# Patient Record
Sex: Female | Born: 1990
Health system: Southern US, Community
[De-identification: ages and names within clinical notes are randomized; demographics above are authoritative.]

## PROBLEM LIST (undated history)

## (undated) ENCOUNTER — Inpatient Hospital Stay (HOSPITAL_COMMUNITY): Payer: Self-pay

## (undated) DIAGNOSIS — R519 Headache, unspecified: Secondary | ICD-10-CM

## (undated) DIAGNOSIS — R87629 Unspecified abnormal cytological findings in specimens from vagina: Secondary | ICD-10-CM

## (undated) DIAGNOSIS — D649 Anemia, unspecified: Secondary | ICD-10-CM

## (undated) DIAGNOSIS — R51 Headache: Secondary | ICD-10-CM

## (undated) DIAGNOSIS — F419 Anxiety disorder, unspecified: Secondary | ICD-10-CM

## (undated) DIAGNOSIS — T7840XA Allergy, unspecified, initial encounter: Secondary | ICD-10-CM

## (undated) DIAGNOSIS — Z8619 Personal history of other infectious and parasitic diseases: Secondary | ICD-10-CM

## (undated) HISTORY — DX: Headache: R51

## (undated) HISTORY — DX: Unspecified abnormal cytological findings in specimens from vagina: R87.629

## (undated) HISTORY — DX: Allergy, unspecified, initial encounter: T78.40XA

## (undated) HISTORY — DX: Anxiety disorder, unspecified: F41.9

## (undated) HISTORY — DX: Headache, unspecified: R51.9

## (undated) HISTORY — DX: Anemia, unspecified: D64.9

## (undated) HISTORY — DX: Personal history of other infectious and parasitic diseases: Z86.19

## (undated) HISTORY — PX: WISDOM TOOTH EXTRACTION: SHX21

---
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
## 2010-08-22 ENCOUNTER — Inpatient Hospital Stay (HOSPITAL_COMMUNITY): Admission: AD | Admit: 2010-08-22 | Discharge: 2010-08-24 | Payer: Self-pay | Admitting: Obstetrics

## 2010-12-13 LAB — RPR: RPR Ser Ql: NONREACTIVE

## 2010-12-13 LAB — CBC
MCHC: 33.4 g/dL (ref 30.0–36.0)
MCHC: 33.5 g/dL (ref 30.0–36.0)
Platelets: 175 10*3/uL (ref 150–400)
RDW: 13.3 % (ref 11.5–15.5)
RDW: 13.5 % (ref 11.5–15.5)

## 2010-12-13 LAB — ABO/RH: ABO/RH(D): O POS

## 2011-07-05 LAB — RAPID STREP SCREEN (MED CTR MEBANE ONLY): Streptococcus, Group A Screen (Direct): POSITIVE — AB

## 2011-10-03 NOTE — L&D Delivery Note (Signed)
Delivery Note Called to room by RN for a precipitous labor.  At 11:35 AM a viable and healthy female was delivered via Vaginal, Spontaneous Delivery (Presentation:  Occiput Anterior).  APGAR: pend , ; weight pend .   Placenta status: Intact, Spontaneous.  Cord:  with the following complications: None.  Cord pH: n/a  Anesthesia: None  Episiotomy: None Lacerations: 1st Degree Suture Repair: None; good hemostasis Est. Blood Loss (mL): 300  Mom to postpartum.  Baby to nursery-stable.  [facility] 05/05/2012, 11:46 AM

## 2012-03-12 LAB — OB RESULTS CONSOLE ANTIBODY SCREEN: Antibody Screen: NEGATIVE

## 2012-03-12 LAB — OB RESULTS CONSOLE HEPATITIS B SURFACE ANTIGEN: Hepatitis B Surface Ag: NEGATIVE

## 2012-03-12 LAB — OB RESULTS CONSOLE GC/CHLAMYDIA: Chlamydia: NEGATIVE

## 2012-03-12 LAB — OB RESULTS CONSOLE RPR: RPR: NONREACTIVE

## 2012-03-12 LAB — OB RESULTS CONSOLE RUBELLA ANTIBODY, IGM: Rubella: IMMUNE

## 2012-03-14 DIAGNOSIS — R112 Nausea with vomiting, unspecified: Secondary | ICD-10-CM

## 2012-04-12 ENCOUNTER — Inpatient Hospital Stay (HOSPITAL_COMMUNITY)
Admission: AD | Admit: 2012-04-12 | Discharge: 2012-04-12 | Disposition: A | Discharge status: Home / Self Care | Payer: Medicaid Other | Source: Ambulatory Visit | Attending: Obstetrics | Admitting: Obstetrics

## 2012-04-12 ENCOUNTER — Encounter (HOSPITAL_COMMUNITY): Payer: Self-pay | Admitting: *Deleted

## 2012-04-12 DIAGNOSIS — O4690 Antepartum hemorrhage, unspecified, unspecified trimester: Secondary | ICD-10-CM | POA: Insufficient documentation

## 2012-04-12 DIAGNOSIS — R112 Nausea with vomiting, unspecified: Secondary | ICD-10-CM

## 2012-04-12 DIAGNOSIS — O2390 Unspecified genitourinary tract infection in pregnancy, unspecified trimester: Secondary | ICD-10-CM | POA: Insufficient documentation

## 2012-04-12 DIAGNOSIS — O212 Late vomiting of pregnancy: Secondary | ICD-10-CM | POA: Insufficient documentation

## 2012-04-12 DIAGNOSIS — R197 Diarrhea, unspecified: Secondary | ICD-10-CM | POA: Insufficient documentation

## 2012-04-12 DIAGNOSIS — N72 Inflammatory disease of cervix uteri: Secondary | ICD-10-CM | POA: Insufficient documentation

## 2012-04-12 LAB — URINALYSIS, ROUTINE W REFLEX MICROSCOPIC
Glucose, UA: NEGATIVE mg/dL
Ketones, ur: NEGATIVE mg/dL
Nitrite: NEGATIVE
Protein, ur: NEGATIVE mg/dL

## 2012-04-12 LAB — BASIC METABOLIC PANEL
Calcium: 9 mg/dL (ref 8.4–10.5)
Creatinine, Ser: 0.53 mg/dL (ref 0.50–1.10)
GFR calc Af Amer: >90 mL/min (ref >90)
Potassium: 4.1 mEq/L (ref 3.5–5.1)

## 2012-04-12 LAB — CBC
HCT: 28.9 % — ABNORMAL LOW (ref 36.0–46.0)
Hemoglobin: 9.8 g/dL — ABNORMAL LOW (ref 12.0–15.0)
MCHC: 33.9 g/dL (ref 30.0–36.0)

## 2012-04-12 LAB — URINE MICROSCOPIC-ADD ON

## 2012-04-12 MED ORDER — ONDANSETRON HCL 4 MG/2ML IJ SOLN
4.0000 mg | Freq: Once | INTRAMUSCULAR | Status: AC
  Administered 2012-04-12: 4 mg via INTRAVENOUS
  Filled 2012-04-12: qty 2

## 2012-04-12 MED ORDER — ONDANSETRON 8 MG PO TBDP: 8.0000 mg | ORAL_TABLET | Freq: Three times a day (TID) | ORAL | Status: AC | PRN

## 2012-04-12 MED ORDER — ONDANSETRON 8 MG PO TBDP: 8.0000 mg | ORAL_TABLET | Freq: Three times a day (TID) | ORAL | Status: DC | PRN

## 2012-04-12 MED ORDER — LACTATED RINGERS IV BOLUS (SEPSIS)
1000.0000 mL | Freq: Once | INTRAVENOUS | Status: AC
  Administered 2012-04-12: 1000 mL via INTRAVENOUS

## 2012-04-12 NOTE — MAU Note (Signed)
Vomitting & diarrhea since Monday, abd cramping.  Pt C/O spotting with urination, no heavy bleeding or LOF.

## 2012-04-12 NOTE — MAU Provider Note (Signed)
History     CSN: [id number]  Arrival date & time 04/12/12  1411   None     Chief Complaint  Patient presents with  . Emesis  . Diarrhea    HPI Ann Santiago is a 21 y.o. female @ [redacted]w[redacted]d gestation who presents to MAU for nausea, vomiting and diarrhea. The symptoms started 4 days ago. Vomiting 4 times a day that looks like undigested food. Diarrhea 6 times a day and is watery brown with bad odor. Patient has been trying to eat soup, spaghetti, cook out, hot dogs and symptoms continue. Cramping in lower abdomen x 2 weeks.  No one else at home has had similar symptoms. The history was provided by the patient.   Past Medical History  Diagnosis Date  . No pertinent past medical history     Past Surgical History  Procedure Date  . Wisdom tooth extraction     History reviewed. No pertinent family history.  History  Substance Use Topics  . Smoking status: Never Smoker   . Smokeless tobacco: Not on file  . Alcohol Use: No    OB History    Grav Para Term Preterm Abortions TAB SAB Ect Mult Living   2 1 1       1       Review of Systems  Constitutional: Negative for fever, chills, diaphoresis and fatigue.  HENT: Negative for ear pain, congestion, sore throat, facial swelling, neck pain, neck stiffness, dental problem and sinus pressure.   Eyes: Negative for photophobia, pain, discharge and visual disturbance.  Respiratory: Negative for cough, chest tightness, shortness of breath and wheezing.   Cardiovascular: Negative for chest pain, palpitations and leg swelling.  Gastrointestinal: Positive for nausea, vomiting, abdominal pain (cramping) and diarrhea. Negative for constipation and abdominal distention.  Genitourinary: Positive for vaginal discharge. Negative for dysuria, urgency, frequency (pressure), flank pain and difficulty urinating. Vaginal bleeding: spotting   Musculoskeletal: Positive for back pain. Negative for myalgias and gait problem.  Skin: Negative for color change  and rash.  Neurological: Positive for headaches. Negative for dizziness, speech difficulty, weakness, light-headedness and numbness.  Hematological: Negative for adenopathy.  Psychiatric/Behavioral: Negative for confusion and agitation. The patient is not nervous/anxious.     Allergies  Review of patient's allergies indicates no known allergies.  Home Medications  No current outpatient prescriptions on file.  BP 107/66  Pulse 86  Temp 97.3 F (36.3 C) (Oral)  Resp 18  Physical Exam  Nursing note and vitals reviewed. Constitutional: She is oriented to person, place, and time. She appears well-developed and well-nourished. No distress.  HENT:  Head: Normocephalic.  Eyes: EOM are normal.  Neck: Normal range of motion. Neck supple.  Cardiovascular: Normal rate.   Pulmonary/Chest: Effort normal.  Abdominal: Soft. There is no tenderness.  Genitourinary:       External genitalia without lesions. Cervix external os 2 cm. Internal os 1 cm. 30%   Musculoskeletal: Normal range of motion.  Neurological: She is alert and oriented to person, place, and time. No cranial nerve deficit.  Skin: Skin is warm and dry.  Psychiatric: She has a normal mood and affect. Her behavior is normal. Judgment and thought content normal.   Results for orders placed during the hospital encounter of 04/12/12 (from the past 24 hour(s))  URINALYSIS, ROUTINE W REFLEX MICROSCOPIC     Status: Abnormal   Collection Time   04/12/12  2:20 PM      Component Value Range   Color,  Urine YELLOW  YELLOW   APPearance HAZY (*) CLEAR   Specific Gravity, Urine 1.015  1.005 - 1.030   pH 7.0  5.0 - 8.0   Glucose, UA NEGATIVE  NEGATIVE mg/dL   Hgb urine dipstick NEGATIVE  NEGATIVE   Bilirubin Urine NEGATIVE  NEGATIVE   Ketones, ur NEGATIVE  NEGATIVE mg/dL   Protein, ur NEGATIVE  NEGATIVE mg/dL   Urobilinogen, UA 1.0  0.0 - 1.0 mg/dL   Nitrite NEGATIVE  NEGATIVE   Leukocytes, UA SMALL (*) NEGATIVE  URINE MICROSCOPIC-ADD  ON     Status: Abnormal   Collection Time   04/12/12  2:20 PM      Component Value Range   Squamous Epithelial / LPF FEW (*) RARE   WBC, UA 3-6  <3 WBC/hpf   Bacteria, UA FEW (*) RARE   Urine-Other MUCOUS PRESENT    CBC     Status: Abnormal   Collection Time   04/12/12  3:43 PM      Component Value Range   WBC 8.3  4.0 - 10.5 K/uL   RBC 3.02 (*) 3.87 - 5.11 MIL/uL   Hemoglobin 9.8 (*) 12.0 - 15.0 g/dL   HCT 16.1 (*) 09.6 - 04.5 %   MCV 95.7  78.0 - 100.0 fL   MCH 32.5  26.0 - 34.0 pg   MCHC 33.9  30.0 - 36.0 g/dL   RDW 40.9  81.1 - 91.4 %   Platelets 171  150 - 400 K/uL  BASIC METABOLIC PANEL     Status: Abnormal   Collection Time   04/12/12  3:43 PM      Component Value Range   Sodium 133 (*) 135 - 145 mEq/L   Potassium 4.1  3.5 - 5.1 mEq/L   Chloride 102  96 - 112 mEq/L   CO2 23  19 - 32 mEq/L   Glucose, Bld 70  70 - 99 mg/dL   BUN 5 (*) 6 - 23 mg/dL   Creatinine, Ser 7.82  0.50 - 1.10 mg/dL   Calcium 9.0  8.4 - 95.6 mg/dL   GFR calc non Af Amer >90  >90 mL/min   GFR calc Af Amer >90  >90 mL/min  WET PREP, GENITAL     Status: Abnormal   Collection Time   04/12/12  5:50 PM      Component Value Range   Yeast Wet Prep HPF POC NONE SEEN  NONE SEEN   Trich, Wet Prep NONE SEEN  NONE SEEN   Clue Cells Wet Prep HPF POC FEW (*) NONE SEEN   WBC, Wet Prep HPF POC MANY (*) NONE SEEN    Assessment: 21 y.o. @ [redacted]w[redacted]d gestation with nausea, vomiting and diarrhea   Vaginal bleeding    Cervicitis  Plan:  IV hydration, Zofran   Zithromax 1 gram po now   Follow up in the office on Monday   Rx Zofran   B.R.A.T. diet  Patient feeling much better after IV hydration and Zofran. ED Course: consult with Dr. Tamela Oddi, agrees with plan of care.   Procedures I have reviewed this patient's vital signs, nurses notes and appropriate labs. I have discussed with the patient results of labs and need for immediate return if bleeding increases or she develops pain. I have discussed with the  patient in detail plan of care. Patient voices understanding.   MDM

## 2012-04-13 LAB — GC/CHLAMYDIA PROBE AMP, GENITAL
Chlamydia, DNA Probe: NEGATIVE
GC Probe Amp, Genital: NEGATIVE

## 2012-05-02 ENCOUNTER — Encounter (HOSPITAL_COMMUNITY): Payer: Self-pay | Admitting: *Deleted

## 2012-05-02 ENCOUNTER — Telehealth (HOSPITAL_COMMUNITY): Payer: Self-pay | Admitting: *Deleted

## 2012-05-02 NOTE — Telephone Encounter (Signed)
Preadmission screen  

## 2012-05-05 ENCOUNTER — Encounter (HOSPITAL_COMMUNITY): Payer: Self-pay | Admitting: Obstetrics and Gynecology

## 2012-05-05 ENCOUNTER — Inpatient Hospital Stay (HOSPITAL_COMMUNITY)
Admission: AD | Admit: 2012-05-05 | Discharge: 2012-05-07 | DRG: 775 | Disposition: A | Discharge status: Home / Self Care | Payer: Medicaid Other | Source: Ambulatory Visit | Attending: Obstetrics | Admitting: Obstetrics

## 2012-05-05 DIAGNOSIS — O99892 Other specified diseases and conditions complicating childbirth: Principal | ICD-10-CM | POA: Diagnosis present

## 2012-05-05 DIAGNOSIS — Z22330 Carrier of Group B streptococcus: Secondary | ICD-10-CM

## 2012-05-05 LAB — CBC
HCT: 33.6 % — ABNORMAL LOW (ref 36.0–46.0)
MCV: 95.7 fL (ref 78.0–100.0)
RDW: 13.4 % (ref 11.5–15.5)
WBC: 6.9 10*3/uL (ref 4.0–10.5)

## 2012-05-05 MED ORDER — IBUPROFEN 600 MG PO TABS
600.0000 mg | ORAL_TABLET | Freq: Four times a day (QID) | ORAL | Status: DC
  Administered 2012-05-05 – 2012-05-07 (×8): 600 mg via ORAL
  Filled 2012-05-05 (×8): qty 1

## 2012-05-05 MED ORDER — OXYCODONE-ACETAMINOPHEN 5-325 MG PO TABS
1.0000 | ORAL_TABLET | ORAL | Status: DC | PRN
  Administered 2012-05-05 – 2012-05-06 (×2): 1 via ORAL
  Filled 2012-05-05 (×2): qty 1

## 2012-05-05 MED ORDER — OXYCODONE-ACETAMINOPHEN 5-325 MG PO TABS
1.0000 | ORAL_TABLET | ORAL | Status: DC | PRN
  Administered 2012-05-05: 1 via ORAL
  Filled 2012-05-05: qty 1

## 2012-05-05 MED ORDER — SENNOSIDES-DOCUSATE SODIUM 8.6-50 MG PO TABS
2.0000 | ORAL_TABLET | Freq: Every day | ORAL | Status: DC
  Administered 2012-05-05 – 2012-05-06 (×2): 2 via ORAL

## 2012-05-05 MED ORDER — BENZOCAINE-MENTHOL 20-0.5 % EX AERO
1.0000 "application " | INHALATION_SPRAY | CUTANEOUS | Status: DC | PRN
  Filled 2012-05-05: qty 56

## 2012-05-05 MED ORDER — PHENYLEPHRINE 40 MCG/ML (10ML) SYRINGE FOR IV PUSH (FOR BLOOD PRESSURE SUPPORT)
80.0000 ug | PREFILLED_SYRINGE | INTRAVENOUS | Status: DC | PRN
  Filled 2012-05-05: qty 5
  Filled 2012-05-05: qty 2

## 2012-05-05 MED ORDER — PHENYLEPHRINE 40 MCG/ML (10ML) SYRINGE FOR IV PUSH (FOR BLOOD PRESSURE SUPPORT)
80.0000 ug | PREFILLED_SYRINGE | INTRAVENOUS | Status: DC | PRN
  Filled 2012-05-05: qty 2

## 2012-05-05 MED ORDER — ZOLPIDEM TARTRATE 5 MG PO TABS: 5.0000 mg | ORAL_TABLET | Freq: Every evening | ORAL | Status: DC | PRN

## 2012-05-05 MED ORDER — EPHEDRINE 5 MG/ML INJ
10.0000 mg | INTRAVENOUS | Status: DC | PRN
  Filled 2012-05-05: qty 4
  Filled 2012-05-05: qty 2

## 2012-05-05 MED ORDER — OXYTOCIN BOLUS FROM INFUSION
250.0000 mL | Freq: Once | INTRAVENOUS | Status: DC
  Filled 2012-05-05: qty 500

## 2012-05-05 MED ORDER — SIMETHICONE 80 MG PO CHEW: 80.0000 mg | CHEWABLE_TABLET | ORAL | Status: DC | PRN

## 2012-05-05 MED ORDER — FLEET ENEMA 7-19 GM/118ML RE ENEM: 1.0000 | ENEMA | RECTAL | Status: DC | PRN

## 2012-05-05 MED ORDER — LACTATED RINGERS IV SOLN: 500.0000 mL | Freq: Once | INTRAVENOUS | Status: DC

## 2012-05-05 MED ORDER — ACETAMINOPHEN 325 MG PO TABS: 650.0000 mg | ORAL_TABLET | ORAL | Status: DC | PRN

## 2012-05-05 MED ORDER — LIDOCAINE HCL (PF) 1 % IJ SOLN
30.0000 mL | INTRAMUSCULAR | Status: DC | PRN
  Filled 2012-05-05 (×2): qty 30

## 2012-05-05 MED ORDER — LACTATED RINGERS IV SOLN
INTRAVENOUS | Status: DC
  Administered 2012-05-05: 11:00:00 via INTRAVENOUS

## 2012-05-05 MED ORDER — LANOLIN HYDROUS EX OINT: TOPICAL_OINTMENT | CUTANEOUS | Status: DC | PRN

## 2012-05-05 MED ORDER — TETANUS-DIPHTH-ACELL PERTUSSIS 5-2.5-18.5 LF-MCG/0.5 IM SUSP
0.5000 mL | Freq: Once | INTRAMUSCULAR | Status: AC
  Administered 2012-05-07: 0.5 mL via INTRAMUSCULAR
  Filled 2012-05-05 (×2): qty 0.5

## 2012-05-05 MED ORDER — ONDANSETRON HCL 4 MG/2ML IJ SOLN: 4.0000 mg | INTRAMUSCULAR | Status: DC | PRN

## 2012-05-05 MED ORDER — LACTATED RINGERS IV SOLN: 500.0000 mL | INTRAVENOUS | Status: DC | PRN

## 2012-05-05 MED ORDER — CITRIC ACID-SODIUM CITRATE 334-500 MG/5ML PO SOLN: 30.0000 mL | ORAL | Status: DC | PRN

## 2012-05-05 MED ORDER — OXYTOCIN 40 UNITS IN LACTATED RINGERS INFUSION - SIMPLE MED: 62.5000 mL/h | INTRAVENOUS | Status: DC | PRN

## 2012-05-05 MED ORDER — WITCH HAZEL-GLYCERIN EX PADS: 1.0000 "application " | MEDICATED_PAD | CUTANEOUS | Status: DC | PRN

## 2012-05-05 MED ORDER — ONDANSETRON HCL 4 MG/2ML IJ SOLN: 4.0000 mg | Freq: Four times a day (QID) | INTRAMUSCULAR | Status: DC | PRN

## 2012-05-05 MED ORDER — ONDANSETRON HCL 4 MG PO TABS: 4.0000 mg | ORAL_TABLET | ORAL | Status: DC | PRN

## 2012-05-05 MED ORDER — EPHEDRINE 5 MG/ML INJ
10.0000 mg | INTRAVENOUS | Status: DC | PRN
  Filled 2012-05-05: qty 2

## 2012-05-05 MED ORDER — OXYTOCIN 40 UNITS IN LACTATED RINGERS INFUSION - SIMPLE MED
62.5000 mL/h | Freq: Once | INTRAVENOUS | Status: AC
  Administered 2012-05-05: 62.5 mL/h via INTRAVENOUS
  Filled 2012-05-05: qty 1000

## 2012-05-05 MED ORDER — PRENATAL MULTIVITAMIN CH
1.0000 | ORAL_TABLET | Freq: Every day | ORAL | Status: DC
  Administered 2012-05-05 – 2012-05-07 (×3): 1 via ORAL
  Filled 2012-05-05 (×3): qty 1

## 2012-05-05 MED ORDER — DIBUCAINE 1 % RE OINT
1.0000 "application " | TOPICAL_OINTMENT | RECTAL | Status: DC | PRN
  Filled 2012-05-05: qty 28

## 2012-05-05 MED ORDER — PENICILLIN G POTASSIUM 5000000 UNITS IJ SOLR
2.5000 10*6.[IU] | INTRAVENOUS | Status: DC
  Filled 2012-05-05 (×4): qty 2.5

## 2012-05-05 MED ORDER — PENICILLIN G POTASSIUM 5000000 UNITS IJ SOLR
5.0000 10*6.[IU] | Freq: Once | INTRAVENOUS | Status: AC
  Administered 2012-05-05: 5 10*6.[IU] via INTRAVENOUS
  Filled 2012-05-05: qty 5

## 2012-05-05 MED ORDER — DIPHENHYDRAMINE HCL 50 MG/ML IJ SOLN: 12.5000 mg | INTRAMUSCULAR | Status: DC | PRN

## 2012-05-05 MED ORDER — MEDROXYPROGESTERONE ACETATE 150 MG/ML IM SUSP: 150.0000 mg | INTRAMUSCULAR | Status: DC | PRN

## 2012-05-05 MED ORDER — DIPHENHYDRAMINE HCL 25 MG PO CAPS: 25.0000 mg | ORAL_CAPSULE | Freq: Four times a day (QID) | ORAL | Status: DC | PRN

## 2012-05-05 MED ORDER — IBUPROFEN 600 MG PO TABS
600.0000 mg | ORAL_TABLET | Freq: Four times a day (QID) | ORAL | Status: DC | PRN
  Administered 2012-05-05: 600 mg via ORAL
  Filled 2012-05-05: qty 1

## 2012-05-05 MED ORDER — FENTANYL 2.5 MCG/ML BUPIVACAINE 1/10 % EPIDURAL INFUSION (WH - ANES)
14.0000 mL/h | INTRAMUSCULAR | Status: DC
  Filled 2012-05-05: qty 60

## 2012-05-05 NOTE — H&P (Signed)
Ann Santiago is a 21 y.o. female presenting for UC's. Maternal Medical History:  Reason for admission: Reason for admission: contractions.  Contractions: Frequency: regular.    Fetal activity: Perceived fetal activity is normal.   Last perceived fetal movement was within the past hour.    Prenatal complications: no prenatal complications Prenatal Complications - Diabetes: none.    OB History    Grav Para Term Preterm Abortions TAB SAB Ect Mult Living   2 1 1       1      Past Medical History  Diagnosis Date  . No pertinent past medical history   . History of chlamydia    Past Surgical History  Procedure Date  . Wisdom tooth extraction    Family History: family history includes Hypertension in her maternal grandmother. Social History:  reports that she has never smoked. She has never used smokeless tobacco. She reports that she does not drink alcohol or use illicit drugs.   Prenatal Transfer Tool  Maternal Diabetes: No Genetic Screening: Normal Maternal Ultrasounds/Referrals: Normal Fetal Ultrasounds or other Referrals:  None Maternal Substance Abuse:  No Significant Maternal Medications:  None Significant Maternal Lab Results:  None Other Comments:  None  Review of Systems  All other systems reviewed and are negative.    Dilation: 6.5 Effacement (%): 100 Station: 0 Exam by:: J. Rasch RN  Blood pressure 122/76, pulse 76, temperature 97.5 F (36.4 C), temperature source Oral, resp. rate 18, height 5\' 3"  (1.6 m), weight 52.617 kg (116 lb). Maternal Exam:  Uterine Assessment: Contraction strength is firm.  Abdomen: Patient reports no abdominal tenderness. Fetal presentation: vertex     Physical Exam  Nursing note and vitals reviewed. Constitutional: She is oriented to person, place, and time. She appears well-developed and well-nourished.  HENT:  Head: Normocephalic and atraumatic.  Eyes: Conjunctivae are normal. Pupils are equal, round, and reactive to  light.  Neck: Normal range of motion. Neck supple.  Cardiovascular: Normal rate and regular rhythm.   Respiratory: Effort normal and breath sounds normal.  GI: Soft.  Genitourinary: Vagina normal and uterus normal.  Musculoskeletal: Normal range of motion.  Neurological: She is oriented to person, place, and time.  Skin: Skin is warm and dry.  Psychiatric: She has a normal mood and affect. Her behavior is normal. Judgment and thought content normal.    Prenatal labs: ABO, Rh:   Antibody: Negative (06/11 0000) Rubella: Immune (06/11 0000) RPR: Nonreactive (06/11 0000)  HBsAg: Negative (06/11 0000)  HIV: Non-reactive (06/11 0000)  GBS: Positive (07/17 0000)   Assessment/Plan: Term pregnancy.  Active labor.  Expectant management.   Jarrette Dehner A 05/05/2012, 11:54 AM

## 2012-05-05 NOTE — MAU Note (Signed)
Pt reports having ctx on and off since this morning. Denies bleeding or leaking and reports good fetal movement

## 2012-05-05 NOTE — Progress Notes (Signed)
Notified of cervical exam, Fetal tracing, pt requests for epidural. Orders received.

## 2012-05-05 NOTE — Progress Notes (Signed)
Ann Santiago is a 21 y.o. G2P1001 at [redacted]w[redacted]d by LMP admitted for active labor  Subjective:   Objective: BP 122/76  Pulse 76  Temp 97.5 F (36.4 C) (Oral)  Resp 18  Ht 5\' 3"  (1.6 m)  Wt 52.617 kg (116 lb)  BMI 20.55 kg/m2      FHT:  FHR: 150 bpm, variability: moderate,  accelerations:  Present,  decelerations:  Absent UC:   regular, every 3 minutes SVE:   Dilation: 6.5 Effacement (%): 100 Station: 0 Exam by:: J. Rasch RN   Labs: Lab Results  Component Value Date   WBC 6.9 05/05/2012   HGB 11.2* 05/05/2012   HCT 33.6* 05/05/2012   MCV 95.7 05/05/2012   PLT 153 05/05/2012    Assessment / Plan: Spontaneous labor, progressing normally  Labor: Progressing normally Preeclampsia:  n/a Fetal Wellbeing:  Category I Pain Control:  Labor support without medications I/D:  n/a Anticipated MOD:  NSVD  HARPER,CHARLES A 05/05/2012, 12:01 PM

## 2012-05-06 LAB — CBC
HCT: 27.4 % — ABNORMAL LOW (ref 36.0–46.0)
MCV: 95.1 fL (ref 78.0–100.0)
RBC: 2.88 MIL/uL — ABNORMAL LOW (ref 3.87–5.11)
WBC: 10.4 10*3/uL (ref 4.0–10.5)

## 2012-05-06 NOTE — Progress Notes (Signed)
Patient ID: Ann Santiago, female   DOB: 07/02/1991, 21 y.o.   MRN: [id number] Postpartum day one Vital signs normal Fundus firm Lochia moderate Legs negative Patient has no complaints

## 2012-05-06 NOTE — Progress Notes (Signed)
Ur chart review completed.  

## 2012-05-07 NOTE — Discharge Summary (Signed)
Obstetric Discharge Summary Reason for Admission: onset of labor Prenatal Procedures: none Intrapartum Procedures: spontaneous vaginal delivery Postpartum Procedures: none Complications-Operative and Postpartum: none Hemoglobin  Date Value Range Status  05/06/2012 9.4* 12.0 - 15.0 g/dL Final     HCT  Date Value Range Status  05/06/2012 27.4* 36.0 - 46.0 % Final    Physical Exam:  General: alert Lochia: appropriate Uterine Fundus: firm Incision: healing well DVT Evaluation: No evidence of DVT seen on physical exam.  Discharge Diagnoses: Term Pregnancy-delivered  Discharge Information: Date: 05/07/2012 Activity: pelvic rest Diet: routine Medications: Percocet Condition: stable Instructions: refer to practice specific booklet Discharge to: home Follow-up Information    Follow up with MARSHALL,BERNARD A, MD. Call in 6 weeks.   Contact information:   [address] [place] [address] [phone]          Newborn Data: Live born female  Birth Weight: 6 lb 8.4 oz (2960 g) APGAR: 9,   Home with mother.  MARSHALL,BERNARD A 05/07/2012, 7:08 AM

## 2012-05-13 ENCOUNTER — Encounter (HOSPITAL_COMMUNITY): Payer: Self-pay | Admitting: *Deleted

## 2012-05-13 ENCOUNTER — Inpatient Hospital Stay (HOSPITAL_COMMUNITY)
Admission: AD | Admit: 2012-05-13 | Discharge: 2012-05-13 | Disposition: A | Discharge status: Home / Self Care | Payer: Medicaid Other | Source: Ambulatory Visit | Attending: Obstetrics | Admitting: Obstetrics

## 2012-05-13 DIAGNOSIS — R51 Headache: Secondary | ICD-10-CM

## 2012-05-13 DIAGNOSIS — O864 Pyrexia of unknown origin following delivery: Secondary | ICD-10-CM | POA: Insufficient documentation

## 2012-05-13 DIAGNOSIS — O99893 Other specified diseases and conditions complicating puerperium: Secondary | ICD-10-CM | POA: Insufficient documentation

## 2012-05-13 LAB — CBC WITH DIFFERENTIAL/PLATELET
Basophils Relative: 0 % (ref 0–1)
Eosinophils Absolute: 0.1 10*3/uL (ref 0.0–0.7)
HCT: 32.1 % — ABNORMAL LOW (ref 36.0–46.0)
Hemoglobin: 10.8 g/dL — ABNORMAL LOW (ref 12.0–15.0)
MCH: 32 pg (ref 26.0–34.0)
MCHC: 33.6 g/dL (ref 30.0–36.0)
Monocytes Absolute: 0.9 10*3/uL (ref 0.1–1.0)
Monocytes Relative: 6 % (ref 3–12)

## 2012-05-13 LAB — COMPREHENSIVE METABOLIC PANEL
Albumin: 3.1 g/dL — ABNORMAL LOW (ref 3.5–5.2)
BUN: 10 mg/dL (ref 6–23)
Creatinine, Ser: 0.54 mg/dL (ref 0.50–1.10)
Total Bilirubin: 0.5 mg/dL (ref 0.3–1.2)
Total Protein: 6.3 g/dL (ref 6.0–8.3)

## 2012-05-13 LAB — URINALYSIS, ROUTINE W REFLEX MICROSCOPIC
Glucose, UA: NEGATIVE mg/dL
Nitrite: NEGATIVE
Protein, ur: NEGATIVE mg/dL
pH: 7.5 (ref 5.0–8.0)

## 2012-05-13 LAB — URINE MICROSCOPIC-ADD ON

## 2012-05-13 MED ORDER — CLINDAMYCIN HCL 150 MG PO CAPS: 150.0000 mg | ORAL_CAPSULE | Freq: Four times a day (QID) | ORAL | Status: AC

## 2012-05-13 MED ORDER — IBUPROFEN 600 MG PO TABS
600.0000 mg | ORAL_TABLET | Freq: Once | ORAL | Status: AC
  Administered 2012-05-13: 600 mg via ORAL
  Filled 2012-05-13: qty 1

## 2012-05-13 MED ORDER — LACTATED RINGERS IV BOLUS (SEPSIS)
1000.0000 mL | Freq: Once | INTRAVENOUS | Status: AC
  Administered 2012-05-13: 1000 mL via INTRAVENOUS

## 2012-05-13 MED ORDER — ACETAMINOPHEN 325 MG PO TABS
650.0000 mg | ORAL_TABLET | Freq: Once | ORAL | Status: AC
  Administered 2012-05-13: 650 mg via ORAL
  Filled 2012-05-13: qty 2

## 2012-05-13 NOTE — MAU Note (Addendum)
Vag delivery 08/04. Headache for 3 days, woke up today with chills. Pt is breast feeding, pt is now pumping because of sore nipples, milk is in.  Baby is doing well. Took 2 tylenol this morning.

## 2012-05-13 NOTE — MAU Note (Signed)
Denies problems with elimination.

## 2012-05-13 NOTE — MAU Provider Note (Signed)
History     CSN: [id number]  Arrival date and time: 05/13/12 1145   None     Chief Complaint  Patient presents with  . Chills  . Headache   HPI Ann Santiago is a 21 y.o. female who is one week post  Vaginal delivery who presents to MAU for headache, chills and fever. The symptoms started 3 days ago with a headache and today developed fever. Took tylenol before coming here today. Associated symptoms include vomiting x 1 today. She denies abdominal pain, UTI symptoms or other problems. Patient's mother states that the patient is not eating and drinking like she should. The history was provided by the patient.   OB History    Grav Para Term Preterm Abortions TAB SAB Ect Mult Living   2 2 2       2       Past Medical History  Diagnosis Date  . No pertinent past medical history   . History of chlamydia     Past Surgical History  Procedure Date  . Wisdom tooth extraction     Family History  Problem Relation Age of Onset  . Hypertension Maternal Grandmother     History  Substance Use Topics  . Smoking status: Never Smoker   . Smokeless tobacco: Never Used  . Alcohol Use: No    Allergies: No Known Allergies  Prescriptions prior to admission  Medication Sig Dispense Refill  . Prenatal Vit-Fe Fumarate-FA (PRENATAL MULTIVITAMIN) TABS Take 1 tablet by mouth daily.        Review of Systems  Constitutional: Positive for fever. Negative for chills, weight loss and malaise/fatigue.  HENT: Negative for ear pain, nosebleeds, congestion, sore throat and neck pain.   Eyes: Negative for blurred vision, double vision, photophobia and pain.  Respiratory: Negative for cough, shortness of breath and wheezing.   Cardiovascular: Negative for chest pain, palpitations and leg swelling.  Gastrointestinal: Positive for nausea and vomiting (x 1 today). Negative for heartburn, abdominal pain, diarrhea and constipation.  Genitourinary: Negative for dysuria, urgency and frequency.    Musculoskeletal: Negative for myalgias and back pain.  Skin: Negative for itching and rash.  Neurological: Positive for dizziness and headaches. Negative for sensory change, speech change, seizures and weakness.  Endo/Heme/Allergies: Does not bruise/bleed easily.  Psychiatric/Behavioral: Negative for depression. The patient is not nervous/anxious and does not have insomnia.    Physical Exam   Blood pressure 87/32, pulse 162, temperature 100.7 F (38.2 C), temperature source Oral, resp. rate 20, unknown if currently breastfeeding.  Physical Exam  Nursing note and vitals reviewed. Constitutional: She is oriented to person, place, and time. She appears well-developed and well-nourished. No distress.       orthostatics   HENT:  Head: Normocephalic and atraumatic.  Right Ear: External ear normal.  Left Ear: External ear normal.  Mouth/Throat: Uvula is midline and oropharynx is clear and moist. No oropharyngeal exudate or posterior oropharyngeal edema.  Eyes: EOM are normal.  Neck: Trachea normal and normal range of motion. Neck supple. No spinous process tenderness and no muscular tenderness present. Normal range of motion present.       No meningeal signs  Cardiovascular:       tachycardia  Respiratory: Effort normal and breath sounds normal. No respiratory distress.  GI: Soft. There is no tenderness.  Musculoskeletal: Normal range of motion. She exhibits no edema.  Neurological: She is alert and oriented to person, place, and time.  Skin: Skin is warm and dry.  Psychiatric: She has a normal mood and affect. Her behavior is normal. Judgment and thought content normal.   Results for orders placed during the hospital encounter of 05/13/12 (from the past 24 hour(s))  CBC WITH DIFFERENTIAL     Status: Abnormal   Collection Time   05/13/12 12:43 PM      Component Value Range   WBC 14.7 (*) 4.0 - 10.5 K/uL   RBC 3.37 (*) 3.87 - 5.11 MIL/uL   Hemoglobin 10.8 (*) 12.0 - 15.0 g/dL   HCT  16.1 (*) 09.6 - 46.0 %   MCV 95.3  78.0 - 100.0 fL   MCH 32.0  26.0 - 34.0 pg   MCHC 33.6  30.0 - 36.0 g/dL   RDW 04.5  40.9 - 81.1 %   Platelets 201  150 - 400 K/uL   Neutrophils Relative 92 (*) 43 - 77 %   Neutro Abs 13.5 (*) 1.7 - 7.7 K/uL   Lymphocytes Relative 2 (*) 12 - 46 %   Lymphs Abs 0.3 (*) 0.7 - 4.0 K/uL   Monocytes Relative 6  3 - 12 %   Monocytes Absolute 0.9  0.1 - 1.0 K/uL   Eosinophils Relative 0  0 - 5 %   Eosinophils Absolute 0.1  0.0 - 0.7 K/uL   Basophils Relative 0  0 - 1 %   Basophils Absolute 0.0  0.0 - 0.1 K/uL  COMPREHENSIVE METABOLIC PANEL     Status: Abnormal   Collection Time   05/13/12 12:43 PM      Component Value Range   Sodium 138  135 - 145 mEq/L   Potassium 3.4 (*) 3.5 - 5.1 mEq/L   Chloride 104  96 - 112 mEq/L   CO2 21  19 - 32 mEq/L   Glucose, Bld 96  70 - 99 mg/dL   BUN 10  6 - 23 mg/dL   Creatinine, Ser 9.14  0.50 - 1.10 mg/dL   Calcium 9.3  8.4 - 78.2 mg/dL   Total Protein 6.3  6.0 - 8.3 g/dL   Albumin 3.1 (*) 3.5 - 5.2 g/dL   AST 15  0 - 37 U/L   ALT 18  0 - 35 U/L   Alkaline Phosphatase 108  39 - 117 U/L   Total Bilirubin 0.5  0.3 - 1.2 mg/dL   GFR calc non Af Amer >90  >90 mL/min   GFR calc Af Amer >90  >90 mL/min  URINALYSIS, ROUTINE W REFLEX MICROSCOPIC     Status: Abnormal   Collection Time   05/13/12 12:55 PM      Component Value Range   Color, Urine YELLOW  YELLOW   APPearance CLEAR  CLEAR   Specific Gravity, Urine 1.010  1.005 - 1.030   pH 7.5  5.0 - 8.0   Glucose, UA NEGATIVE  NEGATIVE mg/dL   Hgb urine dipstick MODERATE (*) NEGATIVE   Bilirubin Urine NEGATIVE  NEGATIVE   Ketones, ur NEGATIVE  NEGATIVE mg/dL   Protein, ur NEGATIVE  NEGATIVE mg/dL   Urobilinogen, UA 1.0  0.0 - 1.0 mg/dL   Nitrite NEGATIVE  NEGATIVE   Leukocytes, UA TRACE (*) NEGATIVE  URINE MICROSCOPIC-ADD ON     Status: Abnormal   Collection Time   05/13/12 12:55 PM      Component Value Range   Squamous Epithelial / LPF FEW (*) RARE   WBC, UA 0-2   <3 WBC/hpf   RBC / HPF 11-20  <3 RBC/hpf  MAU Course: Discussed with Dr. Gaynell Face and he request urine culture and start patient on Clindamycin 150 mg po q6 hours x 10 days and follow up in the office.  Procedures  @ 17:00 patient is feeling some better, tylenol given for headache. Continued IV fluids. Will offer patient food tray and po fluids.   Assessment: 21 y.o. female @ 1 week post vaginal delivery with Headache    Low grade fever  Plan:  IV hydration   Tylenol   Ib profen   Follow up with Dr. Gaynell Face (call tomorrow for follow up)  Re evaluation @ 19:12 pm Patient feeling better. Will d/c home and she will call Dr. Gaynell Face in the morning.  Medication List  As of 05/13/2012  7:17 PM   START taking these medications         clindamycin 150 MG capsule   Commonly known as: CLEOCIN   Take 1 capsule (150 mg total) by mouth every 6 (six) hours.         CONTINUE taking these medications         prenatal multivitamin Tabs          Where to get your medications    These are the prescriptions that you need to pick up.   You may get these medications from any pharmacy.         clindamycin 150 MG capsule           Follow-up Information    Follow up with MARSHALL,BERNARD A, MD. Schedule an appointment as soon as possible for a visit in 1 day.   Contact information:   [address] [place] [address] [phone]         I have reviewed this patient's vital signs, nurses notes and appropriate labs. I have discussed in detail with the patient signs and symptoms that would cause her to need to return. I have discussed in detail with the patient findings and plan of care. Patient voices understanding   Nykiah Ma, RN, FNP, [facility]  05/13/2012, 2:48 PM

## 2012-05-14 LAB — URINE CULTURE: Culture: NO GROWTH

## 2014-08-03 ENCOUNTER — Encounter (HOSPITAL_COMMUNITY): Payer: Self-pay | Admitting: *Deleted

## 2014-12-09 ENCOUNTER — Emergency Department (HOSPITAL_COMMUNITY)
Admission: EM | Admit: 2014-12-09 | Discharge: 2014-12-09 | Disposition: A | Discharge status: Home / Self Care | Payer: Medicaid Other | Attending: Emergency Medicine | Admitting: Emergency Medicine

## 2014-12-09 ENCOUNTER — Emergency Department (HOSPITAL_COMMUNITY): Payer: Medicaid Other

## 2014-12-09 ENCOUNTER — Encounter (HOSPITAL_COMMUNITY): Payer: Self-pay | Admitting: Emergency Medicine

## 2014-12-09 DIAGNOSIS — Z8619 Personal history of other infectious and parasitic diseases: Secondary | ICD-10-CM | POA: Diagnosis not present

## 2014-12-09 DIAGNOSIS — Z3202 Encounter for pregnancy test, result negative: Secondary | ICD-10-CM | POA: Insufficient documentation

## 2014-12-09 DIAGNOSIS — J111 Influenza due to unidentified influenza virus with other respiratory manifestations: Secondary | ICD-10-CM | POA: Diagnosis not present

## 2014-12-09 DIAGNOSIS — R509 Fever, unspecified: Secondary | ICD-10-CM | POA: Diagnosis present

## 2014-12-09 DIAGNOSIS — R05 Cough: Secondary | ICD-10-CM

## 2014-12-09 DIAGNOSIS — R059 Cough, unspecified: Secondary | ICD-10-CM

## 2014-12-09 LAB — URINALYSIS, ROUTINE W REFLEX MICROSCOPIC
Bilirubin Urine: NEGATIVE
Glucose, UA: NEGATIVE mg/dL
KETONES UR: 40 mg/dL — AB
NITRITE: NEGATIVE
PH: 6 (ref 5.0–8.0)
PROTEIN: NEGATIVE mg/dL
SPECIFIC GRAVITY, URINE: 1.023 (ref 1.005–1.030)
Urobilinogen, UA: 1 mg/dL (ref 0.0–1.0)

## 2014-12-09 LAB — CBC WITH DIFFERENTIAL/PLATELET
BASOS PCT: 0 % (ref 0–1)
Basophils Absolute: 0 10*3/uL (ref 0.0–0.1)
EOS PCT: 0 % (ref 0–5)
Eosinophils Absolute: 0 10*3/uL (ref 0.0–0.7)
HEMATOCRIT: 37.4 % (ref 36.0–46.0)
Hemoglobin: 12.4 g/dL (ref 12.0–15.0)
LYMPHS ABS: 0.6 10*3/uL — AB (ref 0.7–4.0)
LYMPHS PCT: 7 % — AB (ref 12–46)
MCH: 31.1 pg (ref 26.0–34.0)
MCHC: 33.2 g/dL (ref 30.0–36.0)
MCV: 93.7 fL (ref 78.0–100.0)
MONO ABS: 0.6 10*3/uL (ref 0.1–1.0)
Monocytes Relative: 6 % (ref 3–12)
Neutro Abs: 7.8 10*3/uL — ABNORMAL HIGH (ref 1.7–7.7)
Neutrophils Relative %: 87 % — ABNORMAL HIGH (ref 43–77)
Platelets: 183 10*3/uL (ref 150–400)
RBC: 3.99 MIL/uL (ref 3.87–5.11)
RDW: 12.8 % (ref 11.5–15.5)
WBC: 9.1 10*3/uL (ref 4.0–10.5)

## 2014-12-09 LAB — COMPREHENSIVE METABOLIC PANEL
ALBUMIN: 4.6 g/dL (ref 3.5–5.2)
ALK PHOS: 72 U/L (ref 39–117)
ALT: 26 U/L (ref 0–35)
AST: 29 U/L (ref 0–37)
Anion gap: 12 (ref 5–15)
BILIRUBIN TOTAL: 0.9 mg/dL (ref 0.3–1.2)
BUN: 11 mg/dL (ref 6–23)
CALCIUM: 9.2 mg/dL (ref 8.4–10.5)
CHLORIDE: 105 mmol/L (ref 96–112)
CO2: 19 mmol/L (ref 19–32)
Creatinine, Ser: 0.72 mg/dL (ref 0.50–1.10)
GFR calc Af Amer: >90 mL/min (ref >90)
Glucose, Bld: 90 mg/dL (ref 70–99)
Potassium: 3.3 mmol/L — ABNORMAL LOW (ref 3.5–5.1)
Sodium: 136 mmol/L (ref 135–145)
Total Protein: 8.2 g/dL (ref 6.0–8.3)

## 2014-12-09 LAB — URINE MICROSCOPIC-ADD ON

## 2014-12-09 LAB — RAPID STREP SCREEN (MED CTR MEBANE ONLY): Streptococcus, Group A Screen (Direct): NEGATIVE

## 2014-12-09 LAB — I-STAT CG4 LACTIC ACID, ED: LACTIC ACID, VENOUS: 1.4 mmol/L (ref 0.5–2.0)

## 2014-12-09 LAB — PREGNANCY, URINE: Preg Test, Ur: NEGATIVE

## 2014-12-09 MED ORDER — DIPHENHYDRAMINE HCL 50 MG/ML IJ SOLN
25.0000 mg | Freq: Once | INTRAMUSCULAR | Status: AC
  Administered 2014-12-09: 25 mg via INTRAVENOUS

## 2014-12-09 MED ORDER — ONDANSETRON HCL 4 MG/2ML IJ SOLN
4.0000 mg | Freq: Once | INTRAMUSCULAR | Status: AC
  Administered 2014-12-09: 4 mg via INTRAVENOUS
  Filled 2014-12-09: qty 2

## 2014-12-09 MED ORDER — DIPHENHYDRAMINE HCL 50 MG/ML IJ SOLN
25.0000 mg | Freq: Once | INTRAMUSCULAR | Status: DC
Start: 2014-12-09 — End: 2014-12-09
  Filled 2014-12-09: qty 1

## 2014-12-09 MED ORDER — SODIUM CHLORIDE 0.9 % IV BOLUS (SEPSIS): 1000.0000 mL | Freq: Once | INTRAVENOUS | Status: DC

## 2014-12-09 MED ORDER — SODIUM CHLORIDE 0.9 % IV BOLUS (SEPSIS)
1000.0000 mL | Freq: Once | INTRAVENOUS | Status: AC
  Administered 2014-12-09 (×2): 1000 mL via INTRAVENOUS

## 2014-12-09 MED ORDER — VANCOMYCIN HCL IN DEXTROSE 1-5 GM/200ML-% IV SOLN
1000.0000 mg | Freq: Once | INTRAVENOUS | Status: AC
  Administered 2014-12-09: 1000 mg via INTRAVENOUS
  Filled 2014-12-09: qty 200

## 2014-12-09 MED ORDER — PIPERACILLIN-TAZOBACTAM 3.375 G IVPB 30 MIN
3.3750 g | Freq: Once | INTRAVENOUS | Status: AC
  Administered 2014-12-09: 3.375 g via INTRAVENOUS
  Filled 2014-12-09: qty 50

## 2014-12-09 NOTE — ED Notes (Addendum)
Pt states she has felt generally weak x 2 days and has had a fever and nasal congestion. 102 F temp. 20g LAC. 1g tylenol given en route. Daughter recently had flu.  Alert and oriented.

## 2014-12-09 NOTE — ED Provider Notes (Signed)
Patient is seen with [name], PA-C.  She presents with fever myalgias and nasal congestion. She had an episode of hypotension here in the ED and was treated initially as a code sepsis. However she states that her daughter has recently had a confirmed positive influenza. She presents with influenza-like symptoms. She feels much better after IV fluids. Her labs are unremarkable and her lactate is normal. I feel that her symptoms are consistent with influenza. She has no shortness of breath. She has no vomiting. She's feeling much better at this point and is ambulating without symptoms. She has no ongoing hypotension. She was discharged home with precautions to return if her symptoms worsen.  Rolan BuccoMelanie Zina Pitzer, MD 12/09/14 [phone]

## 2014-12-09 NOTE — Discharge Instructions (Signed)

## 2014-12-09 NOTE — ED Notes (Signed)
Pt is unable to give a urine specimen at this time. 

## 2014-12-09 NOTE — ED Notes (Signed)
Bed: WTR9 Expected date:  Expected time:  Means of arrival:  Comments: EMS-fever, body aches

## 2014-12-09 NOTE — ED Provider Notes (Signed)
CSN: [id number]     Arrival date & time 12/09/14  1833 History   First MD Initiated Contact with Patient 12/09/14 1934     Chief Complaint  Patient presents with  . Fever  . Generalized Body Aches     (Consider location/radiation/quality/duration/timing/severity/associated sxs/prior Treatment) HPI   PCP: MARSHALL,BERNARD A, MD Blood pressure 101/53, pulse 124, temperature 100 F (37.8 C), temperature source Oral, resp. rate 18, last menstrual period 11/29/2014, SpO2 98 %, unknown if currently breastfeeding.  Ann Santiago is a 24 y.o.female without any significant PMH presents to the ER with complaints of fever, nasal congestion and weakness. Her symptoms started two days ago with a gradual onset severe headache to her forehead that is pressure. She reports not eating or drinking as much due to lack of appetite. She reports feeling so weak that when she stands up for more than a minute she feels as though she may pass out and becomes so weak she must sit down. She also endorses mild cough and and nasal congestion. She reports fever of 103 at home and has been taking Tylenol. She had Tylenol prior to arrival. Her daughter was recently sick with the flu she denies dysuria, back pain, abdominal pain, vomiting, diarrhea, confusion. She denies neck pain or throat pain.   Past Medical History  Diagnosis Date  . No pertinent past medical history   . History of chlamydia    Past Surgical History  Procedure Laterality Date  . Wisdom tooth extraction     Family History  Problem Relation Age of Onset  . Hypertension Maternal Grandmother    History  Substance Use Topics  . Smoking status: Never Smoker   . Smokeless tobacco: Never Used  . Alcohol Use: No   OB History    Gravida Para Term Preterm AB TAB SAB Ectopic Multiple Living   2 2 2       2      Review of Systems  10 Systems reviewed and are negative for acute change except as noted in the HPI.     Allergies  Review of  patient's allergies indicates no known allergies.  Home Medications   Prior to Admission medications   Medication Sig Start Date End Date Taking? Authorizing Provider  GuaiFENesin (MUCINEX PO) Take 1 tablet by mouth every 6 (six) hours as needed (cold symptoms).   Yes Historical Provider, MD   BP 118/90 mmHg  Pulse 76  Temp(Src) 98.9 F (37.2 C) (Oral)  Resp 20  Wt 110 lb (49.896 kg)  SpO2 94%  LMP 11/29/2014 (Approximate) Physical Exam  Constitutional: She is oriented to person, place, and time. She appears well-developed and well-nourished. She has a sickly appearance. No distress.  HENT:  Head: Normocephalic and atraumatic.  Right Ear: Tympanic membrane and ear canal normal.  Left Ear: Tympanic membrane, external ear and ear canal normal.  Nose: Rhinorrhea and sinus tenderness present.  Mouth/Throat: Uvula is midline, oropharynx is clear and moist and mucous membranes are normal.  Eyes: Pupils are equal, round, and reactive to light.  Neck: Normal range of motion. Neck supple. No spinous process tenderness and no muscular tenderness present. No Brudzinski's sign and no Kernig's sign noted.  Cardiovascular: Normal rate and regular rhythm.   Pulmonary/Chest: Effort normal. She has no wheezes. She has rhonchi (mild rhonchi to right lower lung).  Abdominal: Soft. Bowel sounds are normal. She exhibits no distension. There is no tenderness. There is no rigidity and no guarding.  Neurological: She  is alert and oriented to person, place, and time. She displays no atrophy. She displays no seizure activity.  Pt generally is weak with decreased strength.  I stood her up and after 30 minutes she was unable to hold her weight.  Skin: Skin is warm. She is diaphoretic.  Nursing note and vitals reviewed.   ED Course  Procedures (including critical care time) Labs Review Labs Reviewed  CBC WITH DIFFERENTIAL/PLATELET - Abnormal; Notable for the following:    Neutrophils Relative % 87 (*)     Neutro Abs 7.8 (*)    Lymphocytes Relative 7 (*)    Lymphs Abs 0.6 (*)    All other components within normal limits  COMPREHENSIVE METABOLIC PANEL - Abnormal; Notable for the following:    Potassium 3.3 (*)    All other components within normal limits  URINALYSIS, ROUTINE W REFLEX MICROSCOPIC - Abnormal; Notable for the following:    Hgb urine dipstick MODERATE (*)    Ketones, ur 40 (*)    Leukocytes, UA SMALL (*)    All other components within normal limits  URINE MICROSCOPIC-ADD ON - Abnormal; Notable for the following:    Squamous Epithelial / LPF FEW (*)    Bacteria, UA FEW (*)    All other components within normal limits  RAPID STREP SCREEN  URINE CULTURE  CULTURE, BLOOD (ROUTINE X 2)  CULTURE, BLOOD (ROUTINE X 2)  CULTURE, GROUP A STREP  PREGNANCY, URINE  I-STAT CG4 LACTIC ACID, ED    Imaging Review Dg Chest 2 View  12/09/2014   CLINICAL DATA:  Weakness of 2 days duration.  Fever.  EXAM: CHEST  2 VIEW  COMPARISON:  None.  FINDINGS: Heart size is normal. Mediastinal shadows are normal. The lungs are clear. No bronchial thickening. No infiltrate, mass, effusion or collapse. Pulmonary vascularity is normal. No bony abnormality.  IMPRESSION: Normal chest   Electronically Signed   By: Paulina Fusi M.D.   On: 12/09/2014 19:56     EKG Interpretation None      MDM   Final diagnoses:  Influenza   7:40 pm Pt very orthostatic. Sitting she is tachycardic at 125 and when standing this increases to 140 Sitting down her BP is 100/53, when I stood her up her blood pressure dropped to 78/63. Pt recently had Tylenol but still has an oral Temp of 100 here and 102 by EMS. The patient meets level 2 sepsis criteria. I spoke with Dr. Fredderick Phenix and a Level II code sepsis is called- patient will be tracked accordingly, I do not need critical care. Will give 2 liters of fluids and antibiotics and protocol work-up. At the end of shift, the patient will be handed off to Dr. Fredderick Phenix who will  follow up on her labs and progress.   Marlon Pel, PA-C 12/11/14 1610  Rolan Bucco, MD 12/13/14 [id number]

## 2014-12-10 LAB — URINE CULTURE
COLONY COUNT: NO GROWTH
Culture: NO GROWTH

## 2014-12-12 LAB — CULTURE, GROUP A STREP: Strep A Culture: NEGATIVE

## 2014-12-16 LAB — CULTURE, BLOOD (ROUTINE X 2)
CULTURE: NO GROWTH
CULTURE: NO GROWTH

## 2015-02-23 ENCOUNTER — Other Ambulatory Visit (HOSPITAL_COMMUNITY)
Admission: RE | Admit: 2015-02-23 | Discharge: 2015-02-23 | Disposition: A | Discharge status: Home / Self Care | Payer: Medicaid Other | Source: Ambulatory Visit | Attending: Family Medicine | Admitting: Family Medicine

## 2015-02-23 ENCOUNTER — Encounter (HOSPITAL_COMMUNITY): Payer: Self-pay | Admitting: Emergency Medicine

## 2015-02-23 ENCOUNTER — Emergency Department (INDEPENDENT_AMBULATORY_CARE_PROVIDER_SITE_OTHER)
Admission: EM | Admit: 2015-02-23 | Discharge: 2015-02-23 | Disposition: A | Discharge status: Nursing Home (Includes ICF) | Payer: Medicaid Other | Source: Home / Self Care | Attending: Family Medicine | Admitting: Family Medicine

## 2015-02-23 DIAGNOSIS — T192XXA Foreign body in vulva and vagina, initial encounter: Secondary | ICD-10-CM

## 2015-02-23 DIAGNOSIS — N760 Acute vaginitis: Secondary | ICD-10-CM | POA: Insufficient documentation

## 2015-02-23 DIAGNOSIS — Z113 Encounter for screening for infections with a predominantly sexual mode of transmission: Secondary | ICD-10-CM | POA: Diagnosis present

## 2015-02-23 MED ORDER — CEFTRIAXONE SODIUM 1 G IJ SOLR
1.0000 g | Freq: Once | INTRAMUSCULAR | Status: AC
  Administered 2015-02-23: 1 g via INTRAMUSCULAR

## 2015-02-23 MED ORDER — DOXYCYCLINE HYCLATE 100 MG PO CAPS: 100.0000 mg | ORAL_CAPSULE | Freq: Two times a day (BID) | ORAL | Status: DC

## 2015-02-23 MED ORDER — LIDOCAINE HCL (PF) 1 % IJ SOLN
INTRAMUSCULAR | Status: AC
  Filled 2015-02-23: qty 5

## 2015-02-23 MED ORDER — CEFTRIAXONE SODIUM 1 G IJ SOLR
INTRAMUSCULAR | Status: AC
  Filled 2015-02-23: qty 10

## 2015-02-23 NOTE — ED Provider Notes (Signed)
CSN: [id number]     Arrival date & time 02/23/15  1810 History   First MD Initiated Contact with Patient 02/23/15 1917     Chief Complaint  Patient presents with  . Vaginal Discharge   (Consider location/radiation/quality/duration/timing/severity/associated sxs/prior Treatment) Patient is a 24 y.o. female presenting with vaginal discharge. The history is provided by the patient.  Vaginal Discharge Quality:  Malodorous Onset quality:  Gradual Duration:  2 weeks Progression:  Worsening Chronicity:  New Context: recent antibiotic use   Context comment:  Pt took course of flagyl for same but did not help , here for eval.   Past Medical History  Diagnosis Date  . No pertinent past medical history   . History of chlamydia    Past Surgical History  Procedure Laterality Date  . Wisdom tooth extraction     Family History  Problem Relation Age of Onset  . Hypertension Maternal Grandmother    History  Substance Use Topics  . Smoking status: Never Smoker   . Smokeless tobacco: Never Used  . Alcohol Use: No   OB History    Gravida Para Term Preterm AB TAB SAB Ectopic Multiple Living   2 2 2       2      Review of Systems  Constitutional: Negative.   Gastrointestinal: Negative.   Genitourinary: Positive for vaginal discharge. Negative for vaginal bleeding, menstrual problem and pelvic pain.    Allergies  Review of patient's allergies indicates no known allergies.  Home Medications   Prior to Admission medications   Medication Sig Start Date End Date Taking? Authorizing Provider  doxycycline (VIBRAMYCIN) 100 MG capsule Take 1 capsule (100 mg total) by mouth 2 (two) times daily. 02/23/15   Linna HoffJames D Ryun Velez, MD  GuaiFENesin (MUCINEX PO) Take 1 tablet by mouth every 6 (six) hours as needed (cold symptoms).    Historical Provider, MD   BP 125/80 mmHg  Pulse 108  Temp(Src) 100.3 F (37.9 C) (Oral)  Resp 16  SpO2 100% Physical Exam  Constitutional: She is oriented to person,  place, and time. She appears well-developed and well-nourished.  Abdominal: Soft. Bowel sounds are normal.  Genitourinary: Vaginal discharge found.  Retained tampon removed, extreme foul odor, assoc cervicitis, no pelvic tenderness.  Neurological: She is alert and oriented to person, place, and time.  Skin: Skin is warm and dry.  Nursing note and vitals reviewed.   ED Course  Procedures (including critical care time) Labs Review Labs Reviewed  HIV ANTIBODY (ROUTINE TESTING)  CERVICOVAGINAL ANCILLARY ONLY    Imaging Review No results found.   MDM   1. Retained vaginal foreign body, initial encounter        Linna HoffJames D Khelani Kops, MD 02/23/15 2017

## 2015-02-23 NOTE — ED Notes (Signed)
Pt states that she has had vaginal discharge with vaginal odor for 3 weeks.

## 2015-02-23 NOTE — ED Notes (Signed)
Pt will be discharged and [facility] drive [facility].

## 2015-02-23 NOTE — Discharge Instructions (Signed)
Take all of  Medicine, no sex for 2 weeks. See your doctor if further problems.

## 2015-02-24 LAB — HIV ANTIBODY (ROUTINE TESTING W REFLEX): HIV SCREEN 4TH GENERATION: NONREACTIVE

## 2015-02-24 LAB — CERVICOVAGINAL ANCILLARY ONLY
CHLAMYDIA, DNA PROBE: POSITIVE — AB
Neisseria Gonorrhea: NEGATIVE
Wet Prep (BD Affirm): NEGATIVE

## 2015-03-05 NOTE — ED Notes (Signed)
Final lab report review . Positive chlamydia report . Per Dr Artis FlockKindl, treatment adequate day of visit. [facility] DHHS 2124 [facility]. Attempted to call patient, recording lists provided number as "invalid"

## 2015-03-05 NOTE — ED Notes (Signed)
Unable to reach by phone. Letter sent.

## 2015-03-12 NOTE — ED Notes (Signed)
Patient called back after having received letter in mail about lab . After verifying ID discussed positive chlamydia finding. Called in Azithromycin 1 gram as a 1 x dose to [facility] , [address] at patient request. Left detailed Vm on pharmacy answering machine. Discussed w patient need for her partner to be treated as well , other wise they will be re infecting one another . Form 2124 DHHS completed and faxed to [facility] for their records

## 2015-04-13 ENCOUNTER — Other Ambulatory Visit: Payer: Self-pay | Admitting: Urology

## 2015-04-13 DIAGNOSIS — Z30431 Encounter for routine checking of intrauterine contraceptive device: Secondary | ICD-10-CM

## 2015-04-13 DIAGNOSIS — R102 Pelvic and perineal pain: Secondary | ICD-10-CM

## 2015-04-16 ENCOUNTER — Ambulatory Visit
Admission: RE | Admit: 2015-04-16 | Discharge: 2015-04-16 | Disposition: A | Discharge status: Home / Self Care | Payer: Medicaid Other | Source: Ambulatory Visit | Attending: Urology | Admitting: Urology

## 2015-04-16 DIAGNOSIS — R102 Pelvic and perineal pain unspecified side: Secondary | ICD-10-CM

## 2015-04-16 DIAGNOSIS — Z30431 Encounter for routine checking of intrauterine contraceptive device: Secondary | ICD-10-CM

## 2015-05-10 ENCOUNTER — Encounter: Payer: Medicaid Other | Admitting: Obstetrics & Gynecology

## 2015-05-25 ENCOUNTER — Encounter: Payer: Medicaid Other | Admitting: Obstetrics & Gynecology

## 2015-05-28 ENCOUNTER — Encounter: Payer: Self-pay | Admitting: Obstetrics & Gynecology

## 2015-05-28 ENCOUNTER — Ambulatory Visit (INDEPENDENT_AMBULATORY_CARE_PROVIDER_SITE_OTHER): Payer: Medicaid Other | Admitting: Obstetrics & Gynecology

## 2015-05-28 VITALS — BP 112/75 | HR 72 | Ht 63.0 in | Wt 110.0 lb

## 2015-05-28 DIAGNOSIS — Z30433 Encounter for removal and reinsertion of intrauterine contraceptive device: Secondary | ICD-10-CM

## 2015-05-28 DIAGNOSIS — Z30432 Encounter for removal of intrauterine contraceptive device: Secondary | ICD-10-CM

## 2015-05-28 NOTE — Progress Notes (Signed)
   Subjective:    Patient ID: Ann Santiago, female    DOB: 1991-03-22, 24 y.o.   MRN: [id number]  HPI  This 25 yo AA P2 (daughters) is referred here from the health dept as they were unable to remove her IUD. An u/s showed it to be in the proper position. She is on OCPs for contraception. She wants the IUD removed due to cramping.  Review of Systems     Objective:   Physical Exam I removed the IUD very easily with uterine dressing forceps. She tolerated the procedure well.       Assessment & Plan:  Desire for removal of IUD- done

## 2015-05-28 NOTE — Progress Notes (Signed)
Here today as a new patient, referred by Health Department.  Patient wants her IUD removed, the Health department could not find strings but IUD was confirmed by ultrasound, done at [facility].

## 2015-05-31 ENCOUNTER — Encounter: Payer: Self-pay | Admitting: *Deleted

## 2015-06-04 ENCOUNTER — Encounter: Payer: Medicaid Other | Admitting: Obstetrics & Gynecology

## 2015-10-03 NOTE — L&D Delivery Note (Signed)
Delivery Note Pt pushed well and at 2:12 AM a viable female was delivered via Vaginal, Spontaneous Delivery (Presentation: Left Occiput Anterior).  APGAR: 8, 9; weight: pending .   Placenta status: Intact, Spontaneous.  Cord: 3 vessels with the following complications: None.    Anesthesia: Epidural  Episiotomy: None Lacerations: None Est. Blood Loss (mL): 100  Mom to postpartum.  Baby to Couplet care / Skin to Skin.  Cam HaiSHAW, Thierno Hun CNM 04/13/2016, 2:27 AM

## 2015-11-05 LAB — OB RESULTS CONSOLE RPR: RPR: NONREACTIVE

## 2015-11-05 LAB — OB RESULTS CONSOLE HIV ANTIBODY (ROUTINE TESTING): HIV: NONREACTIVE

## 2015-11-05 LAB — OB RESULTS CONSOLE HGB/HCT, BLOOD
HCT: 33 %
Hemoglobin: 11.4 g/dL

## 2015-11-05 LAB — OB RESULTS CONSOLE RUBELLA ANTIBODY, IGM: Rubella: IMMUNE

## 2015-11-05 LAB — OB RESULTS CONSOLE ANTIBODY SCREEN: ANTIBODY SCREEN: NEGATIVE

## 2015-11-05 LAB — OB RESULTS CONSOLE GC/CHLAMYDIA
CHLAMYDIA, DNA PROBE: NEGATIVE
Gonorrhea: NEGATIVE

## 2015-11-05 LAB — OB RESULTS CONSOLE PLATELET COUNT: PLATELETS: 183 10*3/uL

## 2015-11-05 LAB — OB RESULTS CONSOLE HEPATITIS B SURFACE ANTIGEN: Hepatitis B Surface Ag: NEGATIVE

## 2015-11-05 LAB — OB RESULTS CONSOLE ABO/RH: RH TYPE: POSITIVE

## 2016-01-19 ENCOUNTER — Inpatient Hospital Stay (HOSPITAL_COMMUNITY)
Admission: AD | Admit: 2016-01-19 | Discharge: 2016-01-19 | Disposition: A | Discharge status: Home / Self Care | Payer: Medicaid Other | Source: Ambulatory Visit | Attending: Obstetrics | Admitting: Obstetrics

## 2016-01-19 ENCOUNTER — Encounter (HOSPITAL_COMMUNITY): Payer: Self-pay | Admitting: *Deleted

## 2016-01-19 DIAGNOSIS — R21 Rash and other nonspecific skin eruption: Secondary | ICD-10-CM | POA: Diagnosis not present

## 2016-01-19 DIAGNOSIS — O26892 Other specified pregnancy related conditions, second trimester: Secondary | ICD-10-CM | POA: Insufficient documentation

## 2016-01-19 DIAGNOSIS — O9989 Other specified diseases and conditions complicating pregnancy, childbirth and the puerperium: Secondary | ICD-10-CM

## 2016-01-19 DIAGNOSIS — Z3A24 24 weeks gestation of pregnancy: Secondary | ICD-10-CM | POA: Insufficient documentation

## 2016-01-19 NOTE — MAU Provider Note (Signed)
  History     CSN: [id number]  Arrival date and time: 01/19/16 2158   First Provider Initiated Contact with Patient 01/19/16 2226      Chief Complaint  Patient presents with  . Rash   Rash This is a new problem. The current episode started in the past 7 days. The problem is unchanged. The affected locations include the right ankle. The rash is characterized by blistering, redness, swelling and itchiness. It is unknown if there was an exposure to a precipitant. Pertinent negatives include no diarrhea, fever or vomiting. Past treatments include nothing.    Past Medical History  Diagnosis Date  . No pertinent past medical history   . History of chlamydia     Past Surgical History  Procedure Laterality Date  . Wisdom tooth extraction      Family History  Problem Relation Age of Onset  . Hypertension Maternal Grandmother     Social History  Substance Use Topics  . Smoking status: Never Smoker   . Smokeless tobacco: Never Used  . Alcohol Use: Yes     Comment: social    Allergies: No Known Allergies  No prescriptions prior to admission    Review of Systems  Constitutional: Negative for fever and chills.  Gastrointestinal: Negative for nausea, vomiting, abdominal pain, diarrhea and constipation.  Genitourinary: Negative for dysuria, urgency and frequency.  Skin: Positive for rash.   Physical Exam   Blood pressure 104/60, pulse 83, temperature 98.2 F (36.8 C), temperature source Oral, resp. rate 16, height 5\' 1"  (1.549 m), weight 53.978 kg (119 lb), last menstrual period 05/23/2015, SpO2 100 %.  Physical Exam  Nursing note and vitals reviewed. Constitutional: She is oriented to person, place, and time. She appears well-developed and well-nourished. No distress.  HENT:  Head: Normocephalic.  Cardiovascular: Normal rate.   Respiratory: Effort normal.  Neurological: She is alert and oriented to person, place, and time.  Skin: Skin is warm and dry.  2 cm x 4 cm  red lesion on the right ankle. There are about 6-7 tiny blisters inside the area of redness.    FHT: 140, moderate with 10x10 accels, no decels. Appropriate for GA.  Toco: no UCs  MAU Course  Procedures  MDM Area marked with a marker. Patient advised to monitor the area for changes.   Assessment and Plan   1. Rash, skin    DC home Comfort measures reviewed  Benadryl PRN, hydrocortisone, bacitracin PRN  2nd Trimester precautions  PTL precautions  Fetal kick counts RX: none  Return to MAU as needed FU with OB as planned  Follow-up Information    Follow up with Kathreen CosierMARSHALL,BERNARD A, MD.   Specialty:  Obstetrics and Gynecology   Why:  As scheduled   Contact information:   [address] [place] [address] [phone]         Ann Santiago, Ann Santiago 01/19/2016, 10:33 PM

## 2016-01-19 NOTE — MAU Note (Signed)
Pt reports she noticed a place on her outer right ankle about 4 days ago and it started just as an itchy bump and not there are blisters. States she thinks it was a spider bite. Reports her leg is aching.

## 2016-01-19 NOTE — Discharge Instructions (Signed)

## 2016-01-20 ENCOUNTER — Inpatient Hospital Stay (HOSPITAL_COMMUNITY)
Admission: AD | Admit: 2016-01-20 | Discharge: 2016-01-20 | Disposition: A | Discharge status: Home / Self Care | Payer: Medicaid Other | Source: Ambulatory Visit | Attending: Obstetrics | Admitting: Obstetrics

## 2016-01-20 ENCOUNTER — Encounter (HOSPITAL_COMMUNITY): Payer: Self-pay | Admitting: *Deleted

## 2016-01-20 DIAGNOSIS — L03115 Cellulitis of right lower limb: Secondary | ICD-10-CM | POA: Diagnosis not present

## 2016-01-20 DIAGNOSIS — O2342 Unspecified infection of urinary tract in pregnancy, second trimester: Secondary | ICD-10-CM | POA: Diagnosis not present

## 2016-01-20 DIAGNOSIS — R300 Dysuria: Secondary | ICD-10-CM | POA: Diagnosis present

## 2016-01-20 DIAGNOSIS — S90561D Insect bite (nonvenomous), right ankle, subsequent encounter: Secondary | ICD-10-CM | POA: Diagnosis not present

## 2016-01-20 DIAGNOSIS — B9689 Other specified bacterial agents as the cause of diseases classified elsewhere: Secondary | ICD-10-CM | POA: Insufficient documentation

## 2016-01-20 DIAGNOSIS — Z3A27 27 weeks gestation of pregnancy: Secondary | ICD-10-CM | POA: Diagnosis not present

## 2016-01-20 DIAGNOSIS — W57XXXD Bitten or stung by nonvenomous insect and other nonvenomous arthropods, subsequent encounter: Secondary | ICD-10-CM | POA: Insufficient documentation

## 2016-01-20 DIAGNOSIS — L089 Local infection of the skin and subcutaneous tissue, unspecified: Secondary | ICD-10-CM

## 2016-01-20 LAB — URINE MICROSCOPIC-ADD ON

## 2016-01-20 LAB — URINALYSIS, ROUTINE W REFLEX MICROSCOPIC
Glucose, UA: 100 mg/dL — AB
KETONES UR: 15 mg/dL — AB
NITRITE: POSITIVE — AB
PH: 7 (ref 5.0–8.0)
Specific Gravity, Urine: 1.02 (ref 1.005–1.030)

## 2016-01-20 LAB — WET PREP, GENITAL
Clue Cells Wet Prep HPF POC: NONE SEEN
Sperm: NONE SEEN
TRICH WET PREP: NONE SEEN
Yeast Wet Prep HPF POC: NONE SEEN

## 2016-01-20 LAB — AMNISURE RUPTURE OF MEMBRANE (ROM) NOT AT ARMC: Amnisure ROM: NEGATIVE

## 2016-01-20 MED ORDER — TERBUTALINE SULFATE 1 MG/ML IJ SOLN
0.2500 mg | Freq: Once | INTRAMUSCULAR | Status: AC
  Administered 2016-01-20: 0.25 mg via SUBCUTANEOUS
  Filled 2016-01-20: qty 1

## 2016-01-20 MED ORDER — SULFAMETHOXAZOLE-TRIMETHOPRIM 800-160 MG PO TABS: 1.0000 | ORAL_TABLET | Freq: Two times a day (BID) | ORAL | Status: AC

## 2016-01-20 NOTE — MAU Note (Signed)
Pt Stood up to get dressed and had a sharp pain in her pelvis and pink fluid "squirted out. Pt did state she had felt she needed to urinate prior to standing up. . Thinks she might have urinated on herself. Notified provider SSE and  Amniosure collected. Some pooling of fluid noted but  Fern negative and Negative amniosure result. Pt denies anymore leaking fluid. Instructed pt to return if more fluid started to leak out.

## 2016-01-20 NOTE — MAU Provider Note (Signed)
Chief Complaint:  Dysuria   First Provider Initiated Contact with Patient 01/20/16 1657      HPI: Ann Santiago is a 25 y.o. G3P2002 at 64w6dwho presents to maternity admissions reporting pink and bright red bleeding with urination x 24 hours and onset of constant low abdominal pain described as pressure and burning with urination today.  She has tried to increase PO fluids which has not helped.  Nothing makes her symptoms better or worse. She reports good fetal movement, denies LOF, vaginal bleeding, vaginal itching/burning, h/a, dizziness, n/v, or fever/chills.    HPI  Past Medical History: Past Medical History  Diagnosis Date  . No pertinent past medical history   . History of chlamydia     Past obstetric history: OB History  Gravida Para Term Preterm AB SAB TAB Ectopic Multiple Living  # Outcome Date GA Lbr Len/2nd Weight Sex Delivery Anes PTL Lv  3 Current           2 Term 05/05/12 [redacted]w[redacted]d 06:30 / 00:05 6 lb 8.4 oz (2.96 kg) F Vag-Spont None  Y  1 Term 2011 [redacted]w[redacted]d  5 lb 11 oz (2.58 kg) F Vag-Spont   Y      Past Surgical History: Past Surgical History  Procedure Laterality Date  . Wisdom tooth extraction      Family History: Family History  Problem Relation Age of Onset  . Hypertension Maternal Grandmother     Social History: Social History  Substance Use Topics  . Smoking status: Never Smoker   . Smokeless tobacco: Never Used  . Alcohol Use: Yes     Comment: social    Allergies: No Known Allergies  Meds:  No prescriptions prior to admission    ROS:  Review of Systems  Constitutional: Negative for fever, chills and fatigue.  Respiratory: Negative for shortness of breath.   Cardiovascular: Negative for chest pain.  Gastrointestinal: Negative for nausea, vomiting and abdominal pain.  Genitourinary: Positive for dysuria, hematuria, vaginal pain and pelvic pain. Negative for flank pain, vaginal bleeding, vaginal discharge and difficulty  urinating.  Neurological: Negative for dizziness and headaches.  Psychiatric/Behavioral: Negative.      I have reviewed patient's Past Medical Hx, Surgical Hx, Family Hx, Social Hx, medications and allergies.   Physical Exam  Patient Vitals for the past 24 hrs:  BP Temp Temp src Pulse Resp  01/20/16 1549 108/57 mmHg 97.4 F (36.3 C) Oral 105 18   Constitutional: Well-developed, well-nourished female in no acute distress.  Cardiovascular: normal rate Respiratory: normal effort GI: Abd soft, non-tender, gravid appropriate for gestational age.  MS: Extremities nontender, no edema, normal ROM Neurologic: Alert and oriented x 4.  GU: Neg CVAT.  PELVIC EXAM: Cervix pink, visually closed, without lesion, scant white creamy discharge, no bleeding noted on exam, vaginal walls and external genitalia normal  Dilation: 1 Effacement (%): 50 Cervical Position: Posterior Exam by:: L.Leftwich-Kirby,CNM  FHT:  Baseline 135 , moderate variability, accelerations present, no decelerations Contractions: irritability with contractions Q 2-3 minutes, mild to palpation, resolved after terbutaline dose   Labs: Results for orders placed or performed during the hospital encounter of 01/20/16 (from the past 24 hour(s))  Urinalysis, Routine w reflex microscopic (not at [facility])     Status: Abnormal   Collection Time: 01/20/16  4:19 PM  Result Value Ref Range   Color, Urine RED (A) YELLOW   APPearance CLOUDY (A) CLEAR  Specific Gravity, Urine 1.020 1.005 - 1.030   pH 7.0 5.0 - 8.0   Glucose, UA 100 (A) NEGATIVE mg/dL   Hgb urine dipstick LARGE (A) NEGATIVE   Bilirubin Urine MODERATE (A) NEGATIVE   Ketones, ur 15 (A) NEGATIVE mg/dL   Protein, ur >161>300 (A) NEGATIVE mg/dL   Nitrite POSITIVE (A) NEGATIVE   Leukocytes, UA MODERATE (A) NEGATIVE  Urine microscopic-add on     Status: Abnormal   Collection Time: 01/20/16  4:19 PM  Result Value Ref Range   Squamous Epithelial / LPF 0-5 (A) NONE SEEN   WBC,  UA TOO NUMEROUS TO COUNT 0 - 5 WBC/hpf   RBC / HPF TOO NUMEROUS TO COUNT 0 - 5 RBC/hpf   Bacteria, UA RARE (A) NONE SEEN  Wet prep, genital     Status: Abnormal   Collection Time: 01/20/16  4:50 PM  Result Value Ref Range   Yeast Wet Prep HPF POC NONE SEEN NONE SEEN   Trich, Wet Prep NONE SEEN NONE SEEN   Clue Cells Wet Prep HPF POC NONE SEEN NONE SEEN   WBC, Wet Prep HPF POC MANY (A) NONE SEEN   Sperm NONE SEEN       Imaging:  No results found.  MAU Course/MDM: I have ordered labs and reviewed results.  Cervix 1 cm dilated but no evidence of preterm labor with cervix unchanged in > 1 hour in MAU.  Terbutaline 0.25 mg SQ x 1 dose in MAU.  Pt reports complete resolution of pelvic/abdominal pain after medication given.  Urine positive for UTI.  Also, rash on right ankle with significant cellulitis and pus filled vesicles.  Bactrim DS BID x 7 days to cover UTI and cellulitis/bacterial infection of likely insect bite.  If no improvement in rash in 24 hours, pt to f/u with Urgent Care or primary care provider.  Pt stable at time of discharge.  Assessment: 1. UTI in pregnancy, antepartum, second trimester   2. Insect bite, nonvenomous, of ankle, infected, right, subsequent encounter     Plan: Discharge home Preterm labor precautions and fetal kick counts Bactrim DS BID x 7 days  Addendum:  Pt stood up to leave MAU at discharge and reported gush of fluid, wetting her underwear.  No continued leakage after this gush.  Speculum exam with some milky white discharge, no pooling of clear fluid. Ferning negative. Amnnisure negative.  Pt discharged with PTL precautions.   Follow-up Information    Follow up with Kathreen CosierMARSHALL,BERNARD A, MD.   Specialty:  Obstetrics and Gynecology   Why:  As scheduled, Return to MAU as needed for emergencies   Contact information:   [address] [place] [address] [phone]       Please follow up.   Why:  Follow up with primary care/Urgent  Care if no improvement in rash in 24 hours       Medication List    TAKE these medications        sulfamethoxazole-trimethoprim 800-160 MG tablet  Commonly known as:  BACTRIM DS,SEPTRA DS  Take 1 tablet by mouth 2 (two) times daily.        Sharen CounterLisa Leftwich-Kirby Certified Nurse-Midwife 01/20/2016 5:37 PM

## 2016-01-20 NOTE — Discharge Instructions (Signed)
Cellulitis Cellulitis is an infection of the skin and the tissue beneath it. The infected area is usually red and tender. Cellulitis occurs most often in the arms and lower legs.  CAUSES  Cellulitis is caused by bacteria that enter the skin through cracks or cuts in the skin. The most common types of bacteria that cause cellulitis are staphylococci and streptococci. SIGNS AND SYMPTOMS   Redness and warmth.  Swelling.  Tenderness or pain.  Fever. DIAGNOSIS  Your health care provider can usually determine what is wrong based on a physical exam. Blood tests may also be done. TREATMENT  Treatment usually involves taking an antibiotic medicine. HOME CARE INSTRUCTIONS   Take your antibiotic medicine as directed by your health care provider. Finish the antibiotic even if you start to feel better.  Keep the infected arm or leg elevated to reduce swelling.  Apply a warm cloth to the affected area up to 4 times per day to relieve pain.  Take medicines only as directed by your health care provider.  Keep all follow-up visits as directed by your health care provider. SEEK MEDICAL CARE IF:   You notice red streaks coming from the infected area.  Your red area gets larger or turns dark in color.  Your bone or joint underneath the infected area becomes painful after the skin has healed.  Your infection returns in the same area or another area.  You notice a swollen bump in the infected area.  You develop new symptoms.  You have a fever. SEEK IMMEDIATE MEDICAL CARE IF:   You feel very sleepy.  You develop vomiting or diarrhea.  You have a general ill feeling (malaise) with muscle aches and pains.   This information is not intended to replace advice given to you by your health care provider. Make sure you discuss any questions you have with your health care provider.   Document Released: 06/28/2005 Document Revised: 06/09/2015 Document Reviewed: 12/04/2011 Elsevier Interactive  Patient Education 2016 Elsevier Inc.  Pregnancy and Urinary Tract Infection A urinary tract infection (UTI) is a bacterial infection of the urinary tract. Infection of the urinary tract can include the ureters, kidneys (pyelonephritis), bladder (cystitis), and urethra (urethritis). All pregnant women should be screened for bacteria in the urinary tract. Identifying and treating a UTI will decrease the risk of preterm labor and developing more serious infections in both the mother and baby. CAUSES Bacteria germs cause almost all UTIs.  RISK FACTORS Many factors can increase your chances of getting a UTI during pregnancy. These include:  Having a short urethra.  Poor toilet and hygiene habits.  Sexual intercourse.  Blockage of urine along the urinary tract.  Problems with the pelvic muscles or nerves.  Diabetes.  Obesity.  Bladder problems after having several children.  Previous history of UTI. SIGNS AND SYMPTOMS   Pain, burning, or a stinging feeling when urinating.  Suddenly feeling the need to urinate right away (urgency).  Loss of bladder control (urinary incontinence).  Frequent urination, more than is common with pregnancy.  Lower abdominal or back discomfort.  Cloudy urine.  Blood in the urine (hematuria).  Fever. When the kidneys are infected, the symptoms may be:  Back pain.  Flank pain on the right side more so than the left.  Fever.  Chills.  Nausea.  Vomiting. DIAGNOSIS  A urinary tract infection is usually diagnosed through urine tests. Additional tests and procedures are sometimes done. These may include:  Ultrasound exam of the kidneys, ureters, bladder,  and urethra.  Looking in the bladder with a lighted tube (cystoscopy). TREATMENT Typically, UTIs can be treated with antibiotic medicines.  HOME CARE INSTRUCTIONS   Only take over-the-counter or prescription medicines as directed by your health care provider. If you were prescribed  antibiotics, take them as directed. Finish them even if you start to feel better.  Drink enough fluids to keep your urine clear or pale yellow.  Do not have sexual intercourse until the infection is gone and your health care provider says it is okay.  Make sure you are tested for UTIs throughout your pregnancy. These infections often come back. Preventing a UTI in the Future  Practice good toilet habits. Always wipe from front to back. Use the tissue only once.  Do not hold your urine. Empty your bladder as soon as possible when the urge comes.  Do not douche or use deodorant sprays.  Wash with soap and warm water around the genital area and the anus.  Empty your bladder before and after sexual intercourse.  Wear underwear with a cotton crotch.  Avoid caffeine and carbonated drinks. They can irritate the bladder.  Drink cranberry juice or take cranberry pills. This may decrease the risk of getting a UTI.  Do not drink alcohol.  Keep all your appointments and tests as scheduled. SEEK MEDICAL CARE IF:   Your symptoms get worse.  You are still having fevers 2 or more days after treatment begins.  You have a rash.  You feel that you are having problems with medicines prescribed.  You have abnormal vaginal discharge. SEEK IMMEDIATE MEDICAL CARE IF:   You have back or flank pain.  You have chills.  You have blood in your urine.  You have nausea and vomiting.  You have contractions of your uterus.  You have a gush of fluid from the vagina. MAKE SURE YOU:  Understand these instructions.   Will watch your condition.   Will get help right away if you are not doing well or get worse.    This information is not intended to replace advice given to you by your health care provider. Make sure you discuss any questions you have with your health care provider.   Document Released: 01/13/2011 Document Revised: 07/09/2013 Document Reviewed: 04/17/2013 Elsevier  Interactive Patient Education [employer]2016 Elsevier Inc.

## 2016-01-20 NOTE — MAU Note (Signed)
Pt C/O sharp pelvic pain with urination that started this morning, also has noted blood in her urine.  Denies vomiting, diarrhea or fever.

## 2016-01-21 LAB — GC/CHLAMYDIA PROBE AMP (~~LOC~~) NOT AT ARMC
Chlamydia: NEGATIVE
Neisseria Gonorrhea: NEGATIVE

## 2016-01-23 LAB — CULTURE, OB URINE: Culture: >=100000 — AB

## 2016-01-24 ENCOUNTER — Other Ambulatory Visit: Payer: Self-pay | Admitting: Obstetrics and Gynecology

## 2016-01-30 ENCOUNTER — Inpatient Hospital Stay (HOSPITAL_COMMUNITY)
Admission: AD | Admit: 2016-01-30 | Discharge: 2016-01-30 | Disposition: A | Discharge status: Home / Self Care | Payer: Medicaid Other | Source: Ambulatory Visit | Attending: Obstetrics | Admitting: Obstetrics

## 2016-01-30 ENCOUNTER — Encounter (HOSPITAL_COMMUNITY): Payer: Self-pay | Admitting: *Deleted

## 2016-01-30 DIAGNOSIS — Z3A29 29 weeks gestation of pregnancy: Secondary | ICD-10-CM | POA: Insufficient documentation

## 2016-01-30 DIAGNOSIS — R319 Hematuria, unspecified: Secondary | ICD-10-CM

## 2016-01-30 DIAGNOSIS — O2343 Unspecified infection of urinary tract in pregnancy, third trimester: Secondary | ICD-10-CM | POA: Diagnosis not present

## 2016-01-30 LAB — URINALYSIS, ROUTINE W REFLEX MICROSCOPIC
Bilirubin Urine: NEGATIVE
GLUCOSE, UA: NEGATIVE mg/dL
KETONES UR: NEGATIVE mg/dL
NITRITE: NEGATIVE
Protein, ur: 30 mg/dL — AB
SPECIFIC GRAVITY, URINE: 1.015 (ref 1.005–1.030)
pH: 6 (ref 5.0–8.0)

## 2016-01-30 LAB — URINE MICROSCOPIC-ADD ON

## 2016-01-30 MED ORDER — CEPHALEXIN 500 MG PO CAPS: 500.0000 mg | ORAL_CAPSULE | Freq: Four times a day (QID) | ORAL | Status: DC

## 2016-01-30 NOTE — MAU Note (Addendum)
"  urinated blood, 2nd time it was just straight blood." was treated 2 wks ago for UTI.  Denies any symptoms currently.  Denies any hx of previa or low lying placenta.   Reports +FM.  Left eye was swollen when woke up this morning, hurts.

## 2016-01-30 NOTE — Discharge Instructions (Signed)
Pregnancy and Urinary Tract Infection  A urinary tract infection (UTI) is a bacterial infection of the urinary tract. Infection of the urinary tract can include the ureters, kidneys (pyelonephritis), bladder (cystitis), and urethra (urethritis). All pregnant women should be screened for bacteria in the urinary tract. Identifying and treating a UTI will decrease the risk of preterm labor and developing more serious infections in both the mother and baby.  CAUSES  Bacteria germs cause almost all UTIs.   RISK FACTORS  Many factors can increase your chances of getting a UTI during pregnancy. These include:  · Having a short urethra.  · Poor toilet and hygiene habits.  · Sexual intercourse.  · Blockage of urine along the urinary tract.  · Problems with the pelvic muscles or nerves.  · Diabetes.  · Obesity.  · Bladder problems after having several children.  · Previous history of UTI.  SIGNS AND SYMPTOMS   · Pain, burning, or a stinging feeling when urinating.  · Suddenly feeling the need to urinate right away (urgency).  · Loss of bladder control (urinary incontinence).  · Frequent urination, more than is common with pregnancy.  · Lower abdominal or back discomfort.  · Cloudy urine.  · Blood in the urine (hematuria).  · Fever.   When the kidneys are infected, the symptoms may be:  · Back pain.  · Flank pain on the right side more so than the left.  · Fever.  · Chills.  · Nausea.  · Vomiting.  DIAGNOSIS   A urinary tract infection is usually diagnosed through urine tests. Additional tests and procedures are sometimes done. These may include:  · Ultrasound exam of the kidneys, ureters, bladder, and urethra.  · Looking in the bladder with a lighted tube (cystoscopy).  TREATMENT  Typically, UTIs can be treated with antibiotic medicines.   HOME CARE INSTRUCTIONS   · Only take over-the-counter or prescription medicines as directed by your health care provider. If you were prescribed antibiotics, take them as directed. Finish  them even if you start to feel better.  · Drink enough fluids to keep your urine clear or pale yellow.  · Do not have sexual intercourse until the infection is gone and your health care provider says it is okay.  · Make sure you are tested for UTIs throughout your pregnancy. These infections often come back.   Preventing a UTI in the Future  · Practice good toilet habits. Always wipe from front to back. Use the tissue only once.  · Do not hold your urine. Empty your bladder as soon as possible when the urge comes.  · Do not douche or use deodorant sprays.  · Wash with soap and warm water around the genital area and the anus.  · Empty your bladder before and after sexual intercourse.  · Wear underwear with a cotton crotch.  · Avoid caffeine and carbonated drinks. They can irritate the bladder.  · Drink cranberry juice or take cranberry pills. This may decrease the risk of getting a UTI.  · Do not drink alcohol.  · Keep all your appointments and tests as scheduled.   SEEK MEDICAL CARE IF:   · Your symptoms get worse.  · You are still having fevers 2 or more days after treatment begins.  · You have a rash.  · You feel that you are having problems with medicines prescribed.  · You have abnormal vaginal discharge.  SEEK IMMEDIATE MEDICAL CARE IF:   · You have back or flank   pain.  · You have chills.  · You have blood in your urine.  · You have nausea and vomiting.  · You have contractions of your uterus.  · You have a gush of fluid from the vagina.  MAKE SURE YOU:  · Understand these instructions.    · Will watch your condition.    · Will get help right away if you are not doing well or get worse.       This information is not intended to replace advice given to you by your health care provider. Make sure you discuss any questions you have with your health care provider.     Document Released: 01/13/2011 Document Revised: 07/09/2013 Document Reviewed: 04/17/2013  Elsevier Interactive Patient Education ©2016 Elsevier  Inc.

## 2016-01-30 NOTE — MAU Provider Note (Signed)
Chief Complaint:  Hematuria   First Provider Initiated Contact with Patient 01/30/16 1246      HPI: Ann Santiago is a 25 y.o. G3P2002 at [redacted]w[redacted]d who presents to maternity admissions reporting blood in her urine and eye pain/irritation of her left eye.  She was seen 10 days ago and treated for UTI with Bactrim DS BID x 7 days.  She reports her pain and bleeding with urination stopped completely then she noticed blood in her urine again today. She denies pain and reports the bleeding is only in her urine, not seen when she wipes or on her underwear.  She also woke up this morning with some irritation in her left eye. She reports it feels scratchy, like something is in her eye but she does not see anything there.  She has not tried anything for her eye pain/irritation.   She reports good fetal movement, denies LOF, vaginal bleeding, vaginal itching/burning, urinary symptoms, h/a, dizziness, n/v, or fever/chills.    HPI  Past Medical History: Past Medical History  Diagnosis Date  . No pertinent past medical history   . History of chlamydia     Past obstetric history: OB History  Gravida Para Term Preterm AB SAB TAB Ectopic Multiple Living  3 2 2       2     # Outcome Date GA Lbr Len/2nd Weight Sex Delivery Anes PTL Lv  3 Current           2 Term 05/05/12 [id number] 06:30 / 00:05 6 lb 8.4 oz (2.96 kg) F Vag-Spont None  Y  1 Term 2011 [id number]  5 lb 11 oz (2.58 kg) F Vag-Spont   Y      Past Surgical History: Past Surgical History  Procedure Laterality Date  . Wisdom tooth extraction      Family History: Family History  Problem Relation Age of Onset  . Hypertension Maternal Grandmother     Social History: Social History  Substance Use Topics  . Smoking status: Never Smoker   . Smokeless tobacco: Never Used  . Alcohol Use: Yes     Comment: social    Allergies: No Known Allergies  Meds:  No prescriptions prior to admission    ROS:  Review of Systems  Constitutional: Negative for  fever, chills and fatigue.  Eyes: Positive for pain and redness. Negative for discharge and itching.  Respiratory: Negative for shortness of breath.   Cardiovascular: Negative for chest pain.  Genitourinary: Positive for hematuria. Negative for dysuria, flank pain, vaginal bleeding, vaginal discharge, difficulty urinating, vaginal pain and pelvic pain.  Neurological: Negative for dizziness and headaches.  Psychiatric/Behavioral: Negative.      I have reviewed patient's Past Medical Hx, Surgical Hx, Family Hx, Social Hx, medications and allergies.   Physical Exam  Patient Vitals for the past 24 hrs:  BP Temp Temp src Pulse Resp Weight  01/30/16 1247 111/56 mmHg - - - - -  01/30/16 1100 105/58 mmHg 98.3 F (36.8 C) Oral 88 18 118 lb 12.8 oz (53.887 kg)   Constitutional: Well-developed, well-nourished female in no acute distress.  HEENT: Left eye with minimal erythema, no exudate.   Cardiovascular: normal rate Respiratory: normal effort GI: Abd soft, non-tender, gravid appropriate for gestational age.  MS: Extremities nontender, no edema, normal ROM Neurologic: Alert and oriented x 4.  GU: Neg CVAT.  PELVIC EXAM: Deferred, no bleeding noted on pad     FHT:  Baseline 135 , moderate variability, accelerations present, no  decelerations Contractions: None on toco or to palpation    Labs: Results for orders placed or performed during the hospital encounter of 01/30/16 (from the past 24 hour(s))  Urinalysis, Routine w reflex microscopic (not at [facility])     Status: Abnormal   Collection Time: 01/30/16 11:00 AM  Result Value Ref Range   Color, Urine BROWN (A) YELLOW   APPearance CLOUDY (A) CLEAR   Specific Gravity, Urine 1.015 1.005 - 1.030   pH 6.0 5.0 - 8.0   Glucose, UA NEGATIVE NEGATIVE mg/dL   Hgb urine dipstick LARGE (A) NEGATIVE   Bilirubin Urine NEGATIVE NEGATIVE   Ketones, ur NEGATIVE NEGATIVE mg/dL   Protein, ur 30 (A) NEGATIVE mg/dL   Nitrite NEGATIVE NEGATIVE    Leukocytes, UA MODERATE (A) NEGATIVE  Urine microscopic-add on     Status: Abnormal   Collection Time: 01/30/16 11:00 AM  Result Value Ref Range   Squamous Epithelial / LPF 6-30 (A) NONE SEEN   WBC, UA 0-5 0 - 5 WBC/hpf   RBC / HPF TOO NUMEROUS TO COUNT 0 - 5 RBC/hpf   Bacteria, UA FEW (A) NONE SEEN   Urine-Other MUCOUS PRESENT       Imaging:  No results found.  MAU Course/MDM: I have ordered labs and reviewed results.  No evidence of vaginal bleeding on inspection.  Likely UTI not completely resolved.  Urine culture from 4/20 shows Ecoli sensitive to most medications.  Bactrim DS used 10 days ago, so Keflex 500 mg QID x 7 days started today.  Pt to return if pain or increased bleeding.  No evidence of conjunctivitis on inspection of left eye.  Saline eye drops placed by CNM in MAU and pt reported significant improvement.  Pt sent home with saline drops from MAU.  To follow up with primary care if eye symptoms worsen/persist.  Pt stable at time of discharge.  Assessment: 1. Hematuria   2. UTI in pregnancy, antepartum, third trimester     Plan: Discharge home Preterm labor precautions and fetal kick counts Keflex 500 mg QID x 7 days  Follow-up Information    Follow up with MARSHALL,BERNARD A, MD.   Specialty:  Obstetrics and Gynecology   Why:  As scheduled, Return to MAU as needed for emergencies   Contact information:   [address] [place] [address] [phone]        Medication List    TAKE these medications        cephALEXin 500 MG capsule  Commonly known as:  KEFLEX  Take 1 capsule (500 mg total) by mouth 4 (four) times daily.        Sharen Counter Certified Nurse-Midwife 01/30/2016 12:52 PM

## 2016-03-09 LAB — OB RESULTS CONSOLE GC/CHLAMYDIA
Chlamydia: NEGATIVE
Gonorrhea: NEGATIVE

## 2016-03-09 LAB — OB RESULTS CONSOLE GBS: GBS: NEGATIVE

## 2016-04-03 ENCOUNTER — Encounter (HOSPITAL_COMMUNITY): Payer: Self-pay

## 2016-04-03 ENCOUNTER — Inpatient Hospital Stay (HOSPITAL_COMMUNITY)
Admission: AD | Admit: 2016-04-03 | Discharge: 2016-04-03 | Disposition: A | Discharge status: Home / Self Care | Payer: Medicaid Other | Source: Ambulatory Visit | Attending: Obstetrics and Gynecology | Admitting: Obstetrics and Gynecology

## 2016-04-03 DIAGNOSIS — Z3493 Encounter for supervision of normal pregnancy, unspecified, third trimester: Secondary | ICD-10-CM | POA: Insufficient documentation

## 2016-04-03 NOTE — Progress Notes (Signed)
Patient presents to unit with complaints of pressure and contraction pain. Patient brought to labor and delivery for labor check. Orders for fetal monitoring, cervical exam, and walking off of the monitor received from Wynelle BourgeoisMarie Williams, CNM. Patient denies any leaking of clear fluid but states that she has noticed a slight bit of bloody discharge.

## 2016-04-03 NOTE — Discharge Instructions (Signed)
Fetal Movement Counts  Patient Name: __________________________________________________ Patient Due Date: ____________________  Performing a fetal movement count is highly recommended in high-risk pregnancies, but it is good for every pregnant woman to do. Your health care provider may ask you to start counting fetal movements at 28 weeks of the pregnancy. Fetal movements often increase:  · After eating a full meal.  · After physical activity.  · After eating or drinking something sweet or cold.  · At rest.  Pay attention to when you feel the baby is most active. This will help you notice a pattern of your baby's sleep and wake cycles and what factors contribute to an increase in fetal movement. It is important to perform a fetal movement count at the same time each day when your baby is normally most active.   HOW TO COUNT FETAL MOVEMENTS  1. Find a quiet and comfortable area to sit or lie down on your left side. Lying on your left side provides the best blood and oxygen circulation to your baby.  2. Write down the day and time on a sheet of paper or in a journal.  3. Start counting kicks, flutters, swishes, rolls, or jabs in a 2-hour period. You should feel at least 10 movements within 2 hours.  4. If you do not feel 10 movements in 2 hours, wait 2-3 hours and count again. Look for a change in the pattern or not enough counts in 2 hours.  SEEK MEDICAL CARE IF:  · You feel less than 10 counts in 2 hours, tried twice.  · There is no movement in over an hour.  · The pattern is changing or taking longer each day to reach 10 counts in 2 hours.  · You feel the baby is not moving as he or she usually does.  Date: ____________ Movements: ____________ Start time: ____________ Finish time: ____________   Date: ____________ Movements: ____________ Start time: ____________ Finish time: ____________  Date: ____________ Movements: ____________ Start time: ____________ Finish time: ____________  Date: ____________ Movements:  ____________ Start time: ____________ Finish time: ____________  Date: ____________ Movements: ____________ Start time: ____________ Finish time: ____________  Date: ____________ Movements: ____________ Start time: ____________ Finish time: ____________  Date: ____________ Movements: ____________ Start time: ____________ Finish time: ____________  Date: ____________ Movements: ____________ Start time: ____________ Finish time: ____________   Date: ____________ Movements: ____________ Start time: ____________ Finish time: ____________  Date: ____________ Movements: ____________ Start time: ____________ Finish time: ____________  Date: ____________ Movements: ____________ Start time: ____________ Finish time: ____________  Date: ____________ Movements: ____________ Start time: ____________ Finish time: ____________  Date: ____________ Movements: ____________ Start time: ____________ Finish time: ____________  Date: ____________ Movements: ____________ Start time: ____________ Finish time: ____________  Date: ____________ Movements: ____________ Start time: ____________ Finish time: ____________   Date: ____________ Movements: ____________ Start time: ____________ Finish time: ____________  Date: ____________ Movements: ____________ Start time: ____________ Finish time: ____________  Date: ____________ Movements: ____________ Start time: ____________ Finish time: ____________  Date: ____________ Movements: ____________ Start time: ____________ Finish time: ____________  Date: ____________ Movements: ____________ Start time: ____________ Finish time: ____________  Date: ____________ Movements: ____________ Start time: ____________ Finish time: ____________  Date: ____________ Movements: ____________ Start time: ____________ Finish time: ____________   Date: ____________ Movements: ____________ Start time: ____________ Finish time: ____________  Date: ____________ Movements: ____________ Start time: ____________ Finish  time: ____________  Date: ____________ Movements: ____________ Start time: ____________ Finish time: ____________  Date: ____________ Movements: ____________ Start time:   ____________ Finish time: ____________  Date: ____________ Movements: ____________ Start time: ____________ Finish time: ____________  Date: ____________ Movements: ____________ Start time: ____________ Finish time: ____________  Date: ____________ Movements: ____________ Start time: ____________ Finish time: ____________   Date: ____________ Movements: ____________ Start time: ____________ Finish time: ____________  Date: ____________ Movements: ____________ Start time: ____________ Finish time: ____________  Date: ____________ Movements: ____________ Start time: ____________ Finish time: ____________  Date: ____________ Movements: ____________ Start time: ____________ Finish time: ____________  Date: ____________ Movements: ____________ Start time: ____________ Finish time: ____________  Date: ____________ Movements: ____________ Start time: ____________ Finish time: ____________  Date: ____________ Movements: ____________ Start time: ____________ Finish time: ____________   Date: ____________ Movements: ____________ Start time: ____________ Finish time: ____________  Date: ____________ Movements: ____________ Start time: ____________ Finish time: ____________  Date: ____________ Movements: ____________ Start time: ____________ Finish time: ____________  Date: ____________ Movements: ____________ Start time: ____________ Finish time: ____________  Date: ____________ Movements: ____________ Start time: ____________ Finish time: ____________  Date: ____________ Movements: ____________ Start time: ____________ Finish time: ____________  Date: ____________ Movements: ____________ Start time: ____________ Finish time: ____________   Date: ____________ Movements: ____________ Start time: ____________ Finish time: ____________  Date: ____________  Movements: ____________ Start time: ____________ Finish time: ____________  Date: ____________ Movements: ____________ Start time: ____________ Finish time: ____________  Date: ____________ Movements: ____________ Start time: ____________ Finish time: ____________  Date: ____________ Movements: ____________ Start time: ____________ Finish time: ____________  Date: ____________ Movements: ____________ Start time: ____________ Finish time: ____________  Date: ____________ Movements: ____________ Start time: ____________ Finish time: ____________   Date: ____________ Movements: ____________ Start time: ____________ Finish time: ____________  Date: ____________ Movements: ____________ Start time: ____________ Finish time: ____________  Date: ____________ Movements: ____________ Start time: ____________ Finish time: ____________  Date: ____________ Movements: ____________ Start time: ____________ Finish time: ____________  Date: ____________ Movements: ____________ Start time: ____________ Finish time: ____________  Date: ____________ Movements: ____________ Start time: ____________ Finish time: ____________     This information is not intended to replace advice given to you by your health care provider. Make sure you discuss any questions you have with your health care provider.     Document Released: 10/18/2006 Document Revised: 10/09/2014 Document Reviewed: 07/15/2012  Elsevier Interactive Patient Education ©2016 Else[employer] Inc.

## 2016-04-07 ENCOUNTER — Ambulatory Visit (INDEPENDENT_AMBULATORY_CARE_PROVIDER_SITE_OTHER): Payer: Medicaid Other | Admitting: Certified Nurse Midwife

## 2016-04-07 VITALS — BP 97/65 | HR 81 | Temp 98.2°F | Wt 122.5 lb

## 2016-04-07 DIAGNOSIS — Z3493 Encounter for supervision of normal pregnancy, unspecified, third trimester: Secondary | ICD-10-CM | POA: Diagnosis not present

## 2016-04-07 DIAGNOSIS — Z1389 Encounter for screening for other disorder: Secondary | ICD-10-CM | POA: Diagnosis not present

## 2016-04-07 DIAGNOSIS — Z3483 Encounter for supervision of other normal pregnancy, third trimester: Secondary | ICD-10-CM | POA: Insufficient documentation

## 2016-04-07 DIAGNOSIS — Z331 Pregnant state, incidental: Secondary | ICD-10-CM

## 2016-04-07 LAB — POCT URINALYSIS DIPSTICK
Bilirubin, UA: NEGATIVE
Blood, UA: 50
Glucose, UA: NEGATIVE
Ketones, UA: NEGATIVE
Nitrite, UA: NEGATIVE
PH UA: 7
UROBILINOGEN UA: NEGATIVE

## 2016-04-07 MED ORDER — PRENATE PIXIE 10-0.6-0.4-200 MG PO CAPS: 1.0000 | ORAL_CAPSULE | Freq: Every day | ORAL | Status: DC

## 2016-04-07 NOTE — Addendum Note (Signed)
Addended by: Samantha CrimesENNEY, Claudy Abdallah ANNE on: 04/07/2016 05:06 PM   Modules accepted: Orders

## 2016-04-07 NOTE — Progress Notes (Signed)
Subjective:    Ann Santiago is a 25 y.o. female being seen today for her obstetrical visit. She is at 406w0d gestation. Patient reports backache, no bleeding, no cramping, no leaking and occasional contractions. Fetal movement: normal.  Problem List Items Addressed This Visit    None    Visit Diagnoses    Prenatal care, third trimester    -  Primary    Relevant Orders    POCT urinalysis dipstick (Completed)      There are no active problems to display for this patient.   Objective:    BP 97/65 mmHg  Pulse 81  Temp(Src) 98.2 F (36.8 C)  Wt 122 lb 8 oz (55.566 kg)  LMP 05/23/2015 FHT: 135 BPM  Uterine Size: 36 cm and size equals dates  Presentations: cephalic  Pelvic Exam:              Dilation: 3cm       Effacement: 50%             Station:  -3    Consistency: soft            Position: middle     Assessment:    Pregnancy @ 956w0d weeks   GBS neg.  Dr. Gaynell FaceMarshall transfer  Plan:   Plans for delivery: Vaginal anticipated; labs reviewed; problem list updated Counseling: Consent signed. Infant feeding: plans to breastfeed. Cigarette smoking: never smoked. L&D discussion: symptoms of labor, discussed when to call, discussed what number to call, anesthetic/analgesic options reviewed and delivering clinician:  plans no preference. Postpartum supports and preparation: circumcision discussed and contraception plans discussed.  Follow up in 1 Week.

## 2016-04-07 NOTE — Progress Notes (Signed)
Patient was seen at MAU 7/3 for contractions- she is dilation 4cm. Patient has been having intermittent contractions- but nothing that is sending her over again.

## 2016-04-08 ENCOUNTER — Inpatient Hospital Stay (HOSPITAL_COMMUNITY)
Admission: AD | Admit: 2016-04-08 | Discharge: 2016-04-08 | Disposition: A | Discharge status: Home / Self Care | Payer: Medicaid Other | Source: Ambulatory Visit | Attending: Obstetrics and Gynecology | Admitting: Obstetrics and Gynecology

## 2016-04-08 ENCOUNTER — Encounter (HOSPITAL_COMMUNITY): Payer: Self-pay

## 2016-04-08 DIAGNOSIS — Z3483 Encounter for supervision of other normal pregnancy, third trimester: Secondary | ICD-10-CM | POA: Insufficient documentation

## 2016-04-08 LAB — AMNISURE RUPTURE OF MEMBRANE (ROM) NOT AT ARMC: AMNISURE: NEGATIVE

## 2016-04-08 NOTE — MAU Note (Signed)
Having contractions starting last night around midnight, had a gush of fluid this morning around 1030.

## 2016-04-09 ENCOUNTER — Encounter: Payer: Self-pay | Admitting: *Deleted

## 2016-04-12 ENCOUNTER — Encounter (HOSPITAL_COMMUNITY): Payer: Self-pay

## 2016-04-12 ENCOUNTER — Inpatient Hospital Stay (HOSPITAL_COMMUNITY): Payer: Medicaid Other | Admitting: Anesthesiology

## 2016-04-12 ENCOUNTER — Inpatient Hospital Stay (HOSPITAL_COMMUNITY)
Admission: AD | Admit: 2016-04-12 | Discharge: 2016-04-12 | Disposition: A | Discharge status: Home / Self Care | Payer: Medicaid Other | Source: Ambulatory Visit | Attending: Obstetrics & Gynecology | Admitting: Obstetrics & Gynecology

## 2016-04-12 ENCOUNTER — Inpatient Hospital Stay (HOSPITAL_COMMUNITY)
Admission: AD | Admit: 2016-04-12 | Discharge: 2016-04-15 | DRG: 775 | Disposition: A | Discharge status: Home / Self Care | Payer: Medicaid Other | Source: Ambulatory Visit | Attending: Obstetrics & Gynecology | Admitting: Obstetrics & Gynecology

## 2016-04-12 DIAGNOSIS — Z8249 Family history of ischemic heart disease and other diseases of the circulatory system: Secondary | ICD-10-CM

## 2016-04-12 DIAGNOSIS — Z3A39 39 weeks gestation of pregnancy: Secondary | ICD-10-CM

## 2016-04-12 DIAGNOSIS — IMO0001 Reserved for inherently not codable concepts without codable children: Secondary | ICD-10-CM

## 2016-04-12 LAB — CBC
HCT: 33.2 % — ABNORMAL LOW (ref 36.0–46.0)
HEMOGLOBIN: 11.4 g/dL — AB (ref 12.0–15.0)
MCH: 31.1 pg (ref 26.0–34.0)
MCHC: 34.3 g/dL (ref 30.0–36.0)
MCV: 90.5 fL (ref 78.0–100.0)
Platelets: 190 10*3/uL (ref 150–400)
RBC: 3.67 MIL/uL — ABNORMAL LOW (ref 3.87–5.11)
RDW: 13.1 % (ref 11.5–15.5)
WBC: 10.9 10*3/uL — ABNORMAL HIGH (ref 4.0–10.5)

## 2016-04-12 MED ORDER — OXYTOCIN BOLUS FROM INFUSION
500.0000 mL | INTRAVENOUS | Status: DC
  Administered 2016-04-13: 500 mL via INTRAVENOUS

## 2016-04-12 MED ORDER — EPHEDRINE 5 MG/ML INJ
10.0000 mg | INTRAVENOUS | Status: DC | PRN
Start: 2016-04-12 — End: 2016-04-13

## 2016-04-12 MED ORDER — OXYCODONE-ACETAMINOPHEN 5-325 MG PO TABS: 1.0000 | ORAL_TABLET | ORAL | Status: DC | PRN

## 2016-04-12 MED ORDER — OXYCODONE-ACETAMINOPHEN 5-325 MG PO TABS
1.0000 | ORAL_TABLET | Freq: Once | ORAL | Status: AC
  Administered 2016-04-12: 1 via ORAL
  Filled 2016-04-12: qty 1

## 2016-04-12 MED ORDER — PHENYLEPHRINE 40 MCG/ML (10ML) SYRINGE FOR IV PUSH (FOR BLOOD PRESSURE SUPPORT)
PREFILLED_SYRINGE | INTRAVENOUS | Status: AC
  Filled 2016-04-12: qty 20

## 2016-04-12 MED ORDER — SOD CITRATE-CITRIC ACID 500-334 MG/5ML PO SOLN: 30.0000 mL | ORAL | Status: DC | PRN

## 2016-04-12 MED ORDER — LACTATED RINGERS IV SOLN
500.0000 mL | Freq: Once | INTRAVENOUS | Status: AC
  Administered 2016-04-12: 500 mL via INTRAVENOUS

## 2016-04-12 MED ORDER — FENTANYL 2.5 MCG/ML BUPIVACAINE 1/10 % EPIDURAL INFUSION (WH - ANES)
INTRAMUSCULAR | Status: AC
  Administered 2016-04-12: 14 mL/h via EPIDURAL
  Filled 2016-04-12: qty 125

## 2016-04-12 MED ORDER — ONDANSETRON HCL 4 MG/2ML IJ SOLN: 4.0000 mg | Freq: Four times a day (QID) | INTRAMUSCULAR | Status: DC | PRN

## 2016-04-12 MED ORDER — PHENYLEPHRINE 40 MCG/ML (10ML) SYRINGE FOR IV PUSH (FOR BLOOD PRESSURE SUPPORT)
80.0000 ug | PREFILLED_SYRINGE | INTRAVENOUS | Status: AC | PRN
  Administered 2016-04-12 (×3): 80 ug via INTRAVENOUS

## 2016-04-12 MED ORDER — LACTATED RINGERS IV SOLN
INTRAVENOUS | Status: DC
  Administered 2016-04-12: 23:00:00 via INTRAVENOUS

## 2016-04-12 MED ORDER — OXYTOCIN 40 UNITS IN LACTATED RINGERS INFUSION - SIMPLE MED: 2.5000 [IU]/h | INTRAVENOUS | Status: DC

## 2016-04-12 MED ORDER — LACTATED RINGERS IV SOLN: 500.0000 mL | Freq: Once | INTRAVENOUS | Status: DC

## 2016-04-12 MED ORDER — ACETAMINOPHEN 325 MG PO TABS: 650.0000 mg | ORAL_TABLET | ORAL | Status: DC | PRN

## 2016-04-12 MED ORDER — FENTANYL CITRATE (PF) 100 MCG/2ML IJ SOLN: 100.0000 ug | INTRAMUSCULAR | Status: DC | PRN

## 2016-04-12 MED ORDER — LIDOCAINE HCL (PF) 1 % IJ SOLN
INTRAMUSCULAR | Status: DC | PRN
  Administered 2016-04-12 (×2): 4 mL via EPIDURAL

## 2016-04-12 MED ORDER — LIDOCAINE HCL (PF) 1 % IJ SOLN
30.0000 mL | INTRAMUSCULAR | Status: DC | PRN
  Filled 2016-04-12: qty 30

## 2016-04-12 MED ORDER — LACTATED RINGERS IV SOLN
500.0000 mL | INTRAVENOUS | Status: DC | PRN
  Administered 2016-04-13: 500 mL via INTRAVENOUS

## 2016-04-12 MED ORDER — OXYCODONE-ACETAMINOPHEN 5-325 MG PO TABS: 2.0000 | ORAL_TABLET | ORAL | Status: DC | PRN

## 2016-04-12 MED ORDER — PHENYLEPHRINE 40 MCG/ML (10ML) SYRINGE FOR IV PUSH (FOR BLOOD PRESSURE SUPPORT): 80.0000 ug | PREFILLED_SYRINGE | INTRAVENOUS | Status: DC | PRN

## 2016-04-12 MED ORDER — EPHEDRINE 5 MG/ML INJ: 10.0000 mg | INTRAVENOUS | Status: DC | PRN

## 2016-04-12 NOTE — Anesthesia Procedure Notes (Signed)
Epidural Patient location during procedure: OB Start time: 04/12/2016 10:42 PM End time: 04/12/2016 10:48 PM  Staffing Anesthesiologist: Shona SimpsonHOLLIS, Luvern Mcisaac D Performed by: anesthesiologist   Preanesthetic Checklist Completed: patient identified, site marked, surgical consent, pre-op evaluation, timeout performed, IV checked, risks and benefits discussed and monitors and equipment checked  Epidural Patient position: sitting Prep: ChloraPrep Patient monitoring: heart rate, continuous pulse ox and blood pressure Approach: midline Location: L3-L4 Injection technique: LOR saline  Needle:  Needle type: Tuohy  Needle gauge: 17 G Needle length: 9 cm Catheter type: closed end flexible Catheter size: 20 Guage Test dose: negative and 1.5% lidocaine  Assessment Events: blood not aspirated, injection not painful, no injection resistance and no paresthesia  Additional Notes LOR @ 4  Patient identified. Risks/Benefits/Options discussed with patient including but not limited to bleeding, infection, nerve damage, paralysis, failed block, incomplete pain control, headache, blood pressure changes, nausea, vomiting, reactions to medications, itching and postpartum back pain. Confirmed with bedside nurse the patient's most recent platelet count. Confirmed with patient that they are not currently taking any anticoagulation, have any bleeding history or any family history of bleeding disorders. Patient expressed understanding and wished to proceed. All questions were answered. Sterile technique was used throughout the entire procedure. Please see nursing notes for vital signs. Test dose was given through epidural catheter and negative prior to continuing to dose epidural or start infusion. Warning signs of high block given to the patient including shortness of breath, tingling/numbness in hands, complete motor block, or any concerning symptoms with instructions to call for help. Patient was given instructions on  fall risk and not to get out of bed. All questions and concerns addressed with instructions to call with any issues or inadequate analgesia.    Reason for block:procedure for pain

## 2016-04-12 NOTE — MAU Note (Signed)
Pt received to MAU rm 1 c/o contractions every 3 minutes since 12pm. Pt denies leaking fluid or vaginal bleeding. EFM applied. FHR 130. Ann Santiago L Bita Cartwright, RN

## 2016-04-12 NOTE — H&P (Signed)
Ann Santiago is a 25 y.o. female 633P2002 @ 39.5wks presenting for reg ctx. Denies leaking or bldg; no H/A, N/V or visual disturbances. Her prenatal care has been [facility] Dr [facility] and has been essentially unremarkable other than 1) ASCUS Pap 09/2015 2) GBS neg.  History OB History    Gravida Para Term Preterm AB TAB SAB Ectopic Multiple Living   3 2 2       2      Past Medical History  Diagnosis Date  . No pertinent past medical history   . History of chlamydia    Past Surgical History  Procedure Laterality Date  . Wisdom tooth extraction     Family History: family history includes Hypertension in her maternal grandmother. Social History:  reports that she has never smoked. She has never used smokeless tobacco. She reports that she drinks alcohol. She reports that she does not use illicit drugs.   Prenatal Transfer Tool  Maternal Diabetes: No Genetic Screening: Declined Maternal Ultrasounds/Referrals: Normal Fetal Ultrasounds or other Referrals:  None Maternal Substance Abuse:  No Significant Maternal Medications:  None Significant Maternal Lab Results:  Lab values include: Group B Strep negative Other Comments:  None  ROS  Dilation: 6 Effacement (%): 90 Station: -1 Exam by:: Sharen Hintaroline Brewer RNC Blood pressure 124/82, pulse 87, temperature 97.6 F (36.4 C), temperature source Oral, resp. rate 20, height 5\' 1"  (1.549 m), weight 55.339 kg (122 lb), last menstrual period 05/23/2015, SpO2 100 %. Exam Physical Exam  Constitutional: She is oriented to person, place, and time. She appears well-developed.  HENT:  Head: Normocephalic.  Neck: Normal range of motion.  Cardiovascular: Normal rate.   Respiratory: Effort normal.  GI:  EFM 120s, +accels, no decels Ctx q 4 mins  Musculoskeletal: Normal range of motion.  Neurological: She is alert and oriented to person, place, and time.  Skin: Skin is warm and dry.  Psychiatric: She has a normal mood and  affect. Her behavior is normal. Thought content normal.    Prenatal labs: ABO, Rh: O/Positive/-- (02/03 0000) Antibody: Negative (02/03 0000) Rubella: Immune (02/03 0000) RPR: Nonreactive (02/03 0000)  HBsAg: Negative (02/03 0000)  HIV: Non-reactive (02/03 0000)  GBS: Negative (06/08 0000)   Assessment/Plan: IUP@39 .5wks Active labor  [facility] Anticipate SVD   Cam HaiSHAW, Relda Agosto CNM 04/12/2016, 10:22 PM

## 2016-04-12 NOTE — Anesthesia Preprocedure Evaluation (Addendum)
Anesthesia Evaluation  Patient identified by MRN, date of birth, ID band Patient awake    Reviewed: Allergy & Precautions, NPO status , Patient's Chart, lab work & pertinent test results  Airway Mallampati: I       Dental  (+) Teeth Intact, Dental Advisory Given   Pulmonary neg pulmonary ROS,    breath sounds clear to auscultation       Cardiovascular negative cardio ROS   Rhythm:Regular Rate:Normal     Neuro/Psych negative neurological ROS  negative psych ROS   GI/Hepatic negative GI ROS, Neg liver ROS,   Endo/Other  negative endocrine ROS  Renal/GU negative Renal ROS  negative genitourinary   Musculoskeletal negative musculoskeletal ROS (+)   Abdominal   Peds negative pediatric ROS (+)  Hematology negative hematology ROS (+)   Anesthesia Other Findings   Reproductive/Obstetrics (+) Pregnancy                             Lab Results  Component Value Date   WBC 9.1 12/09/2014   HGB 11.4 11/05/2015   HCT 33 11/05/2015   MCV 93.7 12/09/2014   PLT 183 11/05/2015   No results found for: INR, PROTIME  Lab Results  Component Value Date   WBC 10.9* 04/12/2016   HGB 11.4* 04/12/2016   HCT 33.2* 04/12/2016   MCV 90.5 04/12/2016   PLT 190 04/12/2016     Anesthesia Physical Anesthesia Plan  ASA: II  Anesthesia Plan: Epidural   Post-op Pain Management:    Induction:   Airway Management Planned:   Additional Equipment:   Intra-op Plan:   Post-operative Plan:   Informed Consent: I have reviewed the patients History and Physical, chart, labs and discussed the procedure including the risks, benefits and alternatives for the proposed anesthesia with the patient or authorized representative who has indicated his/her understanding and acceptance.     Plan Discussed with:   Anesthesia Plan Comments:         Anesthesia Quick Evaluation

## 2016-04-12 NOTE — MAU Note (Signed)
Pt discharged home with instructions. Verbalizes an understanding. Crystal Scarberry L Kikuye Korenek,RN

## 2016-04-12 NOTE — Progress Notes (Signed)
Notified of pt arrival in MAU and exam. Will admit to labor and delivery.  

## 2016-04-13 ENCOUNTER — Encounter (HOSPITAL_COMMUNITY): Payer: Self-pay

## 2016-04-13 DIAGNOSIS — Z3A39 39 weeks gestation of pregnancy: Secondary | ICD-10-CM

## 2016-04-13 LAB — RPR: RPR Ser Ql: NONREACTIVE

## 2016-04-13 LAB — TYPE AND SCREEN
ABO/RH(D): O POS
Antibody Screen: NEGATIVE

## 2016-04-13 MED ORDER — SIMETHICONE 80 MG PO CHEW: 80.0000 mg | CHEWABLE_TABLET | ORAL | Status: DC | PRN

## 2016-04-13 MED ORDER — COCONUT OIL OIL
1.0000 "application " | TOPICAL_OIL | Status: DC | PRN
  Filled 2016-04-13: qty 120

## 2016-04-13 MED ORDER — SENNOSIDES-DOCUSATE SODIUM 8.6-50 MG PO TABS
2.0000 | ORAL_TABLET | ORAL | Status: DC
  Administered 2016-04-13 – 2016-04-14 (×2): 2 via ORAL
  Filled 2016-04-13 (×2): qty 2

## 2016-04-13 MED ORDER — WITCH HAZEL-GLYCERIN EX PADS: 1.0000 "application " | MEDICATED_PAD | CUTANEOUS | Status: DC | PRN

## 2016-04-13 MED ORDER — DIBUCAINE 1 % RE OINT
1.0000 "application " | TOPICAL_OINTMENT | RECTAL | Status: DC | PRN
  Filled 2016-04-13: qty 28.4

## 2016-04-13 MED ORDER — ZOLPIDEM TARTRATE 5 MG PO TABS: 5.0000 mg | ORAL_TABLET | Freq: Every evening | ORAL | Status: DC | PRN

## 2016-04-13 MED ORDER — BENZOCAINE-MENTHOL 20-0.5 % EX AERO
1.0000 "application " | INHALATION_SPRAY | CUTANEOUS | Status: DC | PRN
  Filled 2016-04-13: qty 56

## 2016-04-13 MED ORDER — IBUPROFEN 600 MG PO TABS
600.0000 mg | ORAL_TABLET | Freq: Four times a day (QID) | ORAL | Status: DC
  Administered 2016-04-13 – 2016-04-15 (×9): 600 mg via ORAL
  Filled 2016-04-13 (×9): qty 1

## 2016-04-13 MED ORDER — ONDANSETRON HCL 4 MG/2ML IJ SOLN: 4.0000 mg | INTRAMUSCULAR | Status: DC | PRN

## 2016-04-13 MED ORDER — TETANUS-DIPHTH-ACELL PERTUSSIS 5-2.5-18.5 LF-MCG/0.5 IM SUSP
0.5000 mL | Freq: Once | INTRAMUSCULAR | Status: DC
Start: 2016-04-14 — End: 2016-04-15

## 2016-04-13 MED ORDER — PRENATAL MULTIVITAMIN CH
1.0000 | ORAL_TABLET | Freq: Every day | ORAL | Status: DC
  Administered 2016-04-13 – 2016-04-14 (×2): 1 via ORAL
  Filled 2016-04-13 (×2): qty 1

## 2016-04-13 MED ORDER — ONDANSETRON HCL 4 MG PO TABS: 4.0000 mg | ORAL_TABLET | ORAL | Status: DC | PRN

## 2016-04-13 MED ORDER — ACETAMINOPHEN 325 MG PO TABS
650.0000 mg | ORAL_TABLET | ORAL | Status: DC | PRN
  Administered 2016-04-13 (×2): 650 mg via ORAL
  Filled 2016-04-13 (×2): qty 2

## 2016-04-13 MED ORDER — DIPHENHYDRAMINE HCL 25 MG PO CAPS
25.0000 mg | ORAL_CAPSULE | Freq: Four times a day (QID) | ORAL | Status: DC | PRN
  Administered 2016-04-13: 25 mg via ORAL
  Filled 2016-04-13: qty 1

## 2016-04-13 NOTE — Lactation Note (Signed)
This note was copied from a baby's chart. Lactation Consultation Note  Patient Name: Ann Darlyne Russianshli Rainbow CNOBS'JToday's Date: 04/13/2016 Reason for consult: Initial assessment  Initial visit with Mom, baby 10 hrs old.  Mom has fed baby 4 times since birth.  Baby sleeping in bed swaddled.  Observed him awaken, and he appeared jittery.  Placed him skin to skin in football hold.  Assisted Mom with manual expression, colostrum easily expressed.  Baby very sleepy, but he did eventually latch.  Demonstrated how to facilitate a deep areolar latch, along with alternate breast compression.  Identified occasional swallowing.  Lots of teaching done.  Baby had previously had a low temp, but now on the breast it is >98.  Discussed with nurse about watching baby, questionable low blood sugar.  Encouraged continued skin to skin, and cue based feedings.   Brochure left in room.  Informed Mom of IP and OP lactation services available to her.  Encouraged her to call prn for assistance, and LC will F/U in am.   LATCH Score/Interventions Latch: Repeated attempts needed to sustain latch, nipple held in mouth throughout feeding, stimulation needed to elicit sucking reflex.  Audible Swallowing: A few with stimulation Intervention(s): Hand expression;Skin to skin Intervention(s): Skin to skin;Hand expression;Alternate breast massage  Type of Nipple: Everted at rest and after stimulation  Comfort (Breast/Nipple): Soft / non-tender     Hold (Positioning): Assistance needed to correctly position infant at breast and maintain latch. Intervention(s): Breastfeeding basics reviewed;Support Pillows;Position options;Skin to skin  LATCH Score: 7      Consult Status Consult Status: Follow-up Date: 04/14/16 Follow-up type: In-patient    Ann Santiago, Ann Santiago 04/13/2016, 12:53 PM

## 2016-04-13 NOTE — Progress Notes (Signed)
Patient ID: Ann Santiago, female   DOB: 02/02/1991, 25 y.o.   MRN: [id number]  CTSP for FHR decel to 80s x 6 mins followed by spont return to BL. Now 130s. Cx 10/C/+1- AROM for mod MSF. Will begin pushing w/ urge.  Anticipate SVD.  Cam HaiSHAW, Ann Santiago CNM 04/13/2016 2:08 AM

## 2016-04-13 NOTE — Anesthesia Postprocedure Evaluation (Signed)
Anesthesia Post Note  Patient: Ann Santiago  Procedure(s) Performed: * No procedures listed *  Patient location during evaluation: Mother Baby Anesthesia Type: Epidural Level of consciousness: awake and alert Pain management: pain level controlled Vital Signs Assessment: post-procedure vital signs reviewed and stable Respiratory status: spontaneous breathing, nonlabored ventilation and respiratory function stable Cardiovascular status: stable Postop Assessment: no headache, no backache, epidural receding and patient able to bend at knees Anesthetic complications: no     Last Vitals:  Filed Vitals:   04/13/16 0345 04/13/16 0445  BP: 112/57 100/67  Pulse: 86 71  Temp: 36.9 C 36.7 C  Resp: 16 16    Last Pain:  Filed Vitals:   04/13/16 0536  PainSc: 0-No pain   Pain Goal: Patients Stated Pain Goal: 8 (04/12/16 2235)               Ann Santiago,Ann Santiago

## 2016-04-13 NOTE — H&P (Signed)
LABOR ADMISSION HISTORY AND PHYSICAL  DIAHN Santiago is a 25 y.o. female G74P2002 with IUP at [redacted]w[redacted]d by 1st trimester [place] presenting for active labor. She reports +FM, + contractions, No LOF, no VB, no blurry vision, headaches or peripheral edema, and RUQ pain.  She plans on breatfeeding. She is undecided with birth contol.  Prenatal History/Complications: Seen at [facility]. No complications this pregnancy.   Past Medical History: Past Medical History  Diagnosis Date  . No pertinent past medical history   . History of chlamydia     Past Surgical History: Past Surgical History  Procedure Laterality Date  . Wisdom tooth extraction      Obstetrical History: OB History    Gravida Para Term Preterm AB TAB SAB Ectopic Multiple Living   Social History: Social History   Social History  . Marital Status: Single    Spouse Name: N/A  . Number of Children: N/A  . Years of Education: N/A   Social History Main Topics  . Smoking status: Never Smoker   . Smokeless tobacco: Never Used  . Alcohol Use: Yes     Comment: social  . Drug Use: No  . Sexual Activity: Yes    Birth Control/ Protection: None   Other Topics Concern  . None   Social History Narrative    Family History: Family History  Problem Relation Age of Onset  . Hypertension Maternal Grandmother     Allergies: No Known Allergies  Prescriptions prior to admission  Medication Sig Dispense Refill Last Dose  . Prenat-FeAsp-Meth-FA-DHA w/o A (PRENATE PIXIE) 10-0.6-0.4-200 MG CAPS Take 1 tablet by mouth daily. (Patient not taking: Reported on 04/12/2016) 30 capsule 12 Not Taking at Unknown time     Review of Systems   All systems reviewed and negative except as stated in HPI  BP 115/80 mmHg  Pulse 108  Temp(Src) 97.6 F (36.4 C) (Oral)  Resp 20  Ht  (1.549 m)  Wt 55.339 kg (122 lb)  BMI 23.06 kg/m2  SpO2 100%  LMP 05/23/2015 General appearance: alert, cooperative and mild  distress Lungs: clear to auscultation bilaterally Heart: regular rate and rhythm Abdomen: soft, non-tender; bowel sounds normal Pelvic: dilated 8/90/-1 Extremities: Homans sign is negative, no sign of DVT, edema Presentation: cephalic Fetal monitoringBaseline: 125 bpm, Variability: Good {> 6 bpm), Accelerations: present and Decelerations: Absent Uterine activityFrequency: Every 3-5 minutes and Duration: 30 seconds Dilation: 8 Effacement (%): 100 Station: -1 Exam by:: Viona Gilmore   Prenatal labs: ABO, Rh: --/--/O POS (07/12 2236) Antibody: NEG (07/12 2236) Rubella: !Error! RPR: Nonreactive (02/03 0000)  HBsAg: Negative (02/03 0000)  HIV: Non-reactive (02/03 0000)  GBS: Negative (06/08 0000)  1 hr Glucola: unknown Genetic screening: declined Anatomy US: normal  Prenatal Transfer Tool  Maternal Diabetes: No Genetic Screening: Declined Maternal Ultrasounds/Referrals: Normal Fetal Ultrasounds or other Referrals:  None Maternal Substance Abuse:  No Significant Maternal Medications:  None Significant Maternal Lab Results: None  Results for orders placed or performed during the hospital encounter of 04/12/16 (from the past 24 hour(s))  CBC   Collection Time: 04/12/16 10:30 PM  Result Value Ref Range   WBC 10.9 (H) 4.0 - 10.5 K/uL   RBC 3.67 (L) 3.87 - 5.11 MIL/uL   Hemoglobin 11.4 (L) 12.0 - 15.0 g/dL   HCT 16.1 (L) 09.6 - 04.5 %   MCV 90.5 78.0 - 100.0 fL   MCH  31.1 26.0 - 34.0 pg   MCHC 34.3 30.0 - 36.0 g/dL   RDW 87.513.1 64.311.5 - 32.915.5 %   Platelets 190 150 - 400 K/uL  Type and screen [facility]   Collection Time: 04/12/16 10:36 PM  Result Value Ref Range   ABO/RH(D) O POS    Antibody Screen NEG    Sample Expiration 04/15/2016     Patient Active Problem List   Diagnosis Date Noted  . Active labor at term 04/12/2016  . Encounter for supervision of other normal pregnancy in third trimester 04/07/2016    Assessment: Philomena Ann Santiago is a 25 y.o. G3P2002  at 4922w6d here for active labor.  #Labor:  #Pain: Epidural on hold #FWB: 125-130 bpm, good accel, no decels #ID: Class I #MOF: breast #MOC:undecided #Circ:  outpatient  @SIGN @

## 2016-04-14 ENCOUNTER — Encounter: Payer: Self-pay | Admitting: Certified Nurse Midwife

## 2016-04-14 DIAGNOSIS — Z3A39 39 weeks gestation of pregnancy: Secondary | ICD-10-CM

## 2016-04-14 NOTE — Progress Notes (Signed)
Post Partum Day #1 Subjective: no complaints, up ad lib, voiding and tolerating PO. Breastfeeding.    Objective: Blood pressure 111/72, pulse 64, temperature 98.4 F (36.9 C), temperature source Oral, resp. rate 18, height 5\' 1"  (1.549 m), weight 122 lb (55.339 kg), last menstrual period 05/23/2015, SpO2 100 %, unknown if currently breastfeeding.  Physical Exam:  General: alert, cooperative and no distress Lochia: appropriate Uterine Fundus: firm Incision: none DVT Evaluation: No evidence of DVT seen on physical exam. No cords or calf tenderness. No significant calf/ankle edema.   Recent Labs  04/12/16 2230  HGB 11.4*  HCT 33.2*    Assessment/Plan: Plan for discharge tomorrow, Breastfeeding, Lactation consult and Contraception Lyletta IUD PP   LOS: 2 days   Ann Santiago, CNM 04/14/2016, 9:32 AM

## 2016-04-14 NOTE — Lactation Note (Signed)
This note was copied from a baby's chart. Lactation Consultation Note: Mom reports baby has been latching well. Reports he nursed a lot through the night - she offered formula once through the night because he was nursing so much but "he didn't like it"  Baby rooting on mom's chest. Suggested latching baby and mom agreeable. Assisted mom in football hold- reviewed getting herself comfortable with plenty of pillows for support. Mom easily able to hand express whitish milk. Baby latched well and still nursing as I left room. No questions at present. To call for assist prn  Patient Name: Ann Santiago ZOXWR'UToday's Date: 04/14/2016 Reason for consult: Follow-up assessment   Maternal Data Formula Feeding for Exclusion: No Has patient been taught Hand Expression?: Yes Does the patient have breastfeeding experience prior to this delivery?: Yes  Feeding Feeding Type: Breast Fed  LATCH Score/Interventions Latch: Grasps breast easily, tongue down, lips flanged, rhythmical sucking. Intervention(s): Skin to skin  Audible Swallowing: A few with stimulation Intervention(s): Skin to skin Intervention(s): Hand expression  Type of Nipple: Everted at rest and after stimulation  Comfort (Breast/Nipple): Soft / non-tender     Hold (Positioning): Assistance needed to correctly position infant at breast and maintain latch.  LATCH Score: 8  Lactation Tools Discussed/Used     Consult Status Consult Status: PRN    Pamelia HoitWeeks, Legrand Lasser D 04/14/2016, 12:08 PM

## 2016-04-15 MED ORDER — IBUPROFEN 600 MG PO TABS: 600.0000 mg | ORAL_TABLET | Freq: Four times a day (QID) | ORAL | Status: DC | PRN

## 2016-04-15 NOTE — Discharge Summary (Signed)
OB Discharge Summary     Patient Name: Ann Santiago DOB: [birth year] MRN: [id number]  Date of admission: 04/12/2016 Delivering MD: Jaci Lazier C   Date of discharge: 04/15/2016  Admitting diagnosis: 40w labor Intrauterine pregnancy: [redacted]w[redacted]d     Secondary diagnosis:  Active Problems:   Active labor at term  Additional problems: ASCUS Pap 09/2015     Discharge diagnosis: Term Pregnancy Delivered                                                                                                Post partum procedures:none  Augmentation: AROM  Complications: None  Hospital course:  Onset of Labor With Vaginal Delivery     25 y.o. yo G3P3003 at [redacted]w[redacted]d was admitted in Active Labor on 04/12/2016. Patient had an uncomplicated labor course as follows:  Membrane Rupture Time/Date: 2:02 AM ,04/13/2016   Intrapartum Procedures: Episiotomy: None [1]                                         Lacerations:  None [1]  Patient had a delivery of a Viable infant. 04/13/2016  Information for the patient's newborn:  Dandrea, Medders [[id number]]  Delivery Method: Vaginal, Spontaneous Delivery (Filed from Delivery Summary)    [name] had an uncomplicated postpartum course.  She is ambulating, tolerating a regular diet, passing flatus, and urinating well. Patient is discharged home in stable condition on 04/15/2016.    Physical exam  Filed Vitals:   04/13/16 1805 04/14/16 0540 04/14/16 1844 04/15/16 0612  BP: 113/64 111/72 121/73 116/70  Pulse: 72 64 73 62  Temp: 98.2 F (36.8 C) 98.4 F (36.9 C) 97.1 F (36.2 C) 97.8 F (36.6 C)  TempSrc: Oral Oral  Oral  Resp: Height:      Weight:      SpO2:   100%    General: alert and cooperative Lochia: appropriate Uterine Fundus: firm Incision: N/A DVT Evaluation: No evidence of DVT seen on physical exam. Labs: Lab Results  Component Value Date   WBC 10.9* 04/12/2016   HGB 11.4* 04/12/2016   HCT 33.2* 04/12/2016   MCV 90.5  04/12/2016   PLT 190 04/12/2016   CMP Latest Ref Rng 12/09/2014  Glucose 70 - 99 mg/dL 90  BUN 6 - 23 mg/dL 11  Creatinine 0.86 - 5.78 mg/dL 4.69  Sodium 629 - 528 mmol/L 136  Potassium 3.5 - 5.1 mmol/L 3.3(L)  Chloride 96 - 112 mmol/L 105  CO2 19 - 32 mmol/L 19  Calcium 8.4 - 10.5 mg/dL 9.2  Total Protein 6.0 - 8.3 g/dL 8.2  Total Bilirubin 0.3 - 1.2 mg/dL 0.9  Alkaline Phos 39 - 117 U/L 72  AST 0 - 37 U/L 29  ALT 0 - 35 U/L 26    Discharge instruction: per After Visit Summary and "Baby and Me Booklet".  After visit meds:    Medication List    TAKE these medications  ibuprofen 600 MG tablet  Commonly known as:  ADVIL,MOTRIN  Take 1 tablet (600 mg total) by mouth every 6 (six) hours as needed.        Diet: routine diet  Activity: Advance as tolerated. Pelvic rest for 6 weeks.   Outpatient follow up:6 weeks Follow up Appt:No future appointments. Follow up Visit:No Follow-up on file.  Postpartum contraception: IUD Mirena  Newborn Data: Live born female  Birth Weight: 6 lb 6.8 oz (2914 g) APGAR: 8, 9  Baby Feeding: Breast Disposition:home with mother   04/15/2016 Cam HaiSHAW, Rozlyn Yerby, CNM  8:58 AM

## 2016-04-15 NOTE — Discharge Instructions (Signed)

## 2016-05-01 ENCOUNTER — Encounter: Payer: Self-pay | Admitting: *Deleted

## 2016-05-05 ENCOUNTER — Ambulatory Visit: Payer: Medicaid Other | Admitting: Certified Nurse Midwife

## 2016-05-05 ENCOUNTER — Other Ambulatory Visit: Payer: Self-pay | Admitting: Obstetrics

## 2016-05-09 ENCOUNTER — Telehealth: Payer: Self-pay | Admitting: *Deleted

## 2016-05-09 NOTE — Telephone Encounter (Signed)
Patient was a Dr Ann Santiago patient- she recently delivered and we have received FMLA paperwork for her. Left message for her to call us- she has also no showed 2 appointments- encouraged she make an appointment so we can make sure she is doing OK.

## 2016-05-18 ENCOUNTER — Telehealth: Payer: Self-pay | Admitting: *Deleted

## 2016-05-18 NOTE — Telephone Encounter (Signed)
Follow up call- left message for patient regarding her paperwork- will hold 1 week and if no response- will shred. Also encouraged patient to call for post partum appointment.

## 2016-10-02 NOTE — L&D Delivery Note (Signed)
Delivery Note At 3:18 PM a viable female was delivered via Vaginal, Spontaneous Delivery (Presentation: OA).  Delayed cord clamping x1 min. Infant taken to warmer and received by NICU team. APGAR: 9, 9; weight pending.   Placenta status: intact via Tomasa BlaseSchultz. Cord: 3V, with the following complications: none. Cord pH: n/a  Anesthesia: None   Episiotomy: None Lacerations: None  Est. Blood Loss (mL): 25  Mom to postpartum.  Baby to Couplet care / Skin to Skin.  Donette LarryMelanie Bhambri, CNM 02/02/2017, 4:30 PM  Attestation of Attending Supervision of Certified Nurse Midwife: Evaluation and management procedures were performed by the CNM under my supervision.  I have reviewed the CNM's note and chart, and I agree with the management and plan. [id number]  Cornelia Copaharlie Cornelious Diven, Jr MD Attending Center for [facility] Mid[facility](Faculty Practice)

## 2016-10-24 ENCOUNTER — Telehealth: Payer: Self-pay | Admitting: *Deleted

## 2016-10-24 NOTE — Telephone Encounter (Signed)
Pt called to office stating she had some questions. Ask for return call.  Attempt to contact pt. No answer, LM on VM to call office.

## 2016-10-24 NOTE — Telephone Encounter (Signed)
Pt returned call to office. Pt states that she had a baby in 2017 and has not had regular cycles since. Pt states that she thinks she is currently pregnant. Pt states LMP in November.  Pt states that she has scheduled an abortion for Saturday.  Pt has appt here tomorrow with Rachelle.  Pt would like to know what she should do. Pt advised to keep appt tomorrow.  Pt made aware that a UPT can be done in office, possible labs if needed and provider will determine what needs to be done regarding possible pregnancy. Pt advised that if she were to not come to office and goes to clinic for abortion, an u/s should be done prior to verify gestational age.  Pt strongly advised to come for appt in order to determine pregnancy.  Pt states understanding and states will come to appt.

## 2016-10-25 ENCOUNTER — Inpatient Hospital Stay (HOSPITAL_COMMUNITY)
Admission: AD | Admit: 2016-10-25 | Discharge: 2016-10-25 | Disposition: A | Discharge status: Home / Self Care | Payer: Medicaid Other | Source: Ambulatory Visit | Attending: Obstetrics & Gynecology | Admitting: Obstetrics & Gynecology

## 2016-10-25 ENCOUNTER — Encounter (HOSPITAL_COMMUNITY): Payer: Self-pay | Admitting: *Deleted

## 2016-10-25 ENCOUNTER — Ambulatory Visit (INDEPENDENT_AMBULATORY_CARE_PROVIDER_SITE_OTHER): Payer: Medicaid Other | Admitting: Advanced Practice Midwife

## 2016-10-25 VITALS — BP 101/71 | HR 85 | Temp 97.8°F | Wt 113.1 lb

## 2016-10-25 DIAGNOSIS — R109 Unspecified abdominal pain: Secondary | ICD-10-CM | POA: Insufficient documentation

## 2016-10-25 DIAGNOSIS — O26891 Other specified pregnancy related conditions, first trimester: Secondary | ICD-10-CM

## 2016-10-25 DIAGNOSIS — Z3A20 20 weeks gestation of pregnancy: Secondary | ICD-10-CM | POA: Insufficient documentation

## 2016-10-25 DIAGNOSIS — N949 Unspecified condition associated with female genital organs and menstrual cycle: Secondary | ICD-10-CM

## 2016-10-25 DIAGNOSIS — O26892 Other specified pregnancy related conditions, second trimester: Secondary | ICD-10-CM | POA: Insufficient documentation

## 2016-10-25 NOTE — MAU Provider Note (Signed)
  History     CSN: [id number]  Arrival date and time: 10/25/16 [id number]   First Provider Initiated Contact with Patient 10/25/16 1005      Chief Complaint  Patient presents with  . Abdominal Pain   HPI Ms. Ann Santiago is a 26 y.o. [id number] at unsure [place] who presents to MAU today with complaint of abdominal pain. The patient states a history of irregular vaginal bleeding since delivery of last child in July. She states multiple negative HPTs in the past few months. She states pain is mild and intermittent in the lower abdomen. She denies vaginal bleeding, discharge or UTI symptoms.   OB History    Gravida Para Term Preterm AB Living   4 3 3     3    SAB TAB Ectopic Multiple Live Births         0 3      Past Medical History:  Diagnosis Date  . History of chlamydia   . No pertinent past medical history     Past Surgical History:  Procedure Laterality Date  . WISDOM TOOTH EXTRACTION      Family History  Problem Relation Age of Onset  . Hypertension Maternal Grandmother     Social History  Substance Use Topics  . Smoking status: Never Smoker  . Smokeless tobacco: Never Used  . Alcohol use Yes     Comment: social    Allergies: No Known Allergies  Prescriptions Prior to Admission  Medication Sig Dispense Refill Last Dose  . ibuprofen (ADVIL,MOTRIN) 600 MG tablet Take 1 tablet (600 mg total) by mouth every 6 (six) hours as needed. 30 tablet 0 Taking    Review of Systems  Constitutional: Negative for fever.  Gastrointestinal: Positive for abdominal pain. Negative for constipation, diarrhea, nausea and vomiting.  Genitourinary: Negative for dysuria, frequency, urgency, vaginal bleeding and vaginal discharge.   Physical Exam   Blood pressure 104/63, pulse 70, temperature 97.8 F (36.6 C), temperature source Oral, resp. rate 16, height 5' 3.25" (1.607 m), weight 113 lb 12.8 oz (51.6 kg), last menstrual period 08/16/2016, unknown if currently breastfeeding.  Physical  Exam  Nursing note and vitals reviewed. Constitutional: She is oriented to person, place, and time. She appears well-developed and well-nourished. No distress.  HENT:  Head: Normocephalic and atraumatic.  Cardiovascular: Normal rate.   Respiratory: Effort normal.  GI: Soft. She exhibits no distension and no mass. There is no tenderness. There is no rebound and no guarding.  Genitourinary: Uterus is enlarged (around the level of the umbilicus).  Neurological: She is alert and oriented to person, place, and time.  Skin: Skin is warm and dry. No erythema.  Psychiatric: She has a normal mood and affect.    MAU Course  Procedures None  MDM FHR - 136 bpm with doppler Patient does not want to keep this pregnancy and has an appointment with an abortion clinic on Saturday. Advised that she may be too far along, but they will do an US at that time.   Assessment and Plan  A: SIUP at ~ [redacted] weeks gestation Abdominal pain in pregnancy, second trimester  P:  Discharge home Tylenol PRN for pain advised Second trimester precautions discussed Patient advised to follow-up with Abortion clinic as scheduled Patient may return to MAU as needed or if her condition were to change or worsen  Marny LowensteinJulie N Maurisio Ruddy, PA-C  10/25/2016, 10:05 AM

## 2016-10-25 NOTE — Discharge Instructions (Signed)
Round Ligament Pain Introduction The round ligament is a cord of muscle and tissue that helps to support the uterus. It can become a source of pain during pregnancy if it becomes stretched or twisted as the baby grows. The pain usually begins in the second trimester of pregnancy, and it can come and go until the baby is delivered. It is not a serious problem, and it does not cause harm to the baby. Round ligament pain is usually a short, sharp, and pinching pain, but it can also be a dull, lingering, and aching pain. The pain is felt in the lower side of the abdomen or in the groin. It usually starts deep in the groin and moves up to the outside of the hip area. Pain can occur with:  A sudden change in position.  Rolling over in bed.  Coughing or sneezing.  Physical activity. Follow these instructions at home: Watch your condition for any changes. Take these steps to help with your pain:  When the pain starts, relax. Then try:  Sitting down.  Flexing your knees up to your abdomen.  Lying on your side with one pillow under your abdomen and another pillow between your legs.  Sitting in a warm bath for 15-20 minutes or until the pain goes away.  Take over-the-counter and prescription medicines only as told by your health care provider.  Move slowly when you sit and stand.  Avoid long walks if they cause pain.  Stop or lessen your physical activities if they cause pain. Contact a health care provider if:  Your pain does not go away with treatment.  You feel pain in your back that you did not have before.  Your medicine is not helping. Get help right away if:  You develop a fever or chills.  You develop uterine contractions.  You develop vaginal bleeding.  You develop nausea or vomiting.  You develop diarrhea.  You have pain when you urinate. This information is not intended to replace advice given to you by your health care provider. Make sure you discuss any questions  you have with your health care provider. Document Released: 06/27/2008 Document Revised: 02/24/2016 Document Reviewed: 11/25/2014  2017 Elsevier  

## 2016-10-25 NOTE — Progress Notes (Signed)
Subjective:     Ann Santiago is a 26 y.o. female here for abdominal pain and possible pregnancy.  She reports intermittent pain, increased with movement and with urination and bowel movements, described as sharp and mostly in the right lower side.  She denies associated symptoms.  She has irregular menses so unsure of LMP.  She plans to terminate this pregnancy and desires LARC afterwards.      Gynecologic History No LMP recorded. Contraception: OCP (estrogen/progesterone)   Obstetric History OB History  Gravida Para Term Preterm AB Living  3 3 3     3   SAB TAB Ectopic Multiple Live Births        0 3    # Outcome Date GA Lbr Len/2nd Weight Sex Delivery Anes PTL Lv  3 Term 04/13/16 154w6d 05:56 / 00:16 6 lb 6.8 oz (2.914 kg) M Vag-Spont EPI  LIV  2 Term 05/05/12 2355w3d 06:30 / 00:05 6 lb 8.4 oz (2.96 kg) F Vag-Spont None  LIV  1 Term 2011 2720w0d  5 lb 11 oz (2.58 kg) F Vag-Spont   LIV       The following portions of the patient's history were reviewed and updated as appropriate: allergies, current medications, past family history, past medical history, past social history, past surgical history and problem list.  Review of Systems Pertinent items noted in HPI and remainder of comprehensive ROS otherwise negative.    Objective:     VS reviewed, nursing note reviewed,  Constitutional: well developed, well nourished, no distress HEENT: normocephalic CV: normal rate Pulm/chest wall: normal effort Abdomen: soft Neuro: alert and oriented x 3 Skin: warm, dry Psych: affect normal    Assessment:    Abdominal pain in pregnancy, first trimester    Plan:    Pt to MAU today for evaluation for ectopic pregnancy  Discussed LARCs vs Depo vs BTL and pt would like to try Mirena again after this pregnancy.

## 2016-10-25 NOTE — MAU Note (Signed)
Sent over from office, +preg test, pain on rt side, started 2 wks. Gotten to the point she can't lay on that side at night.  Home tests were coming back neg, + in office today.  Both pt and mom state the belly is something new.  Had a del in July

## 2016-11-22 ENCOUNTER — Other Ambulatory Visit (HOSPITAL_COMMUNITY)
Admission: RE | Admit: 2016-11-22 | Discharge: 2016-11-22 | Disposition: A | Discharge status: Home / Self Care | Payer: Medicaid Other | Source: Ambulatory Visit | Attending: Obstetrics & Gynecology | Admitting: Obstetrics & Gynecology

## 2016-11-22 ENCOUNTER — Encounter: Payer: Self-pay | Admitting: Obstetrics & Gynecology

## 2016-11-22 ENCOUNTER — Ambulatory Visit (INDEPENDENT_AMBULATORY_CARE_PROVIDER_SITE_OTHER): Payer: Medicaid Other | Admitting: Obstetrics & Gynecology

## 2016-11-22 DIAGNOSIS — Z113 Encounter for screening for infections with a predominantly sexual mode of transmission: Secondary | ICD-10-CM | POA: Diagnosis present

## 2016-11-22 DIAGNOSIS — O0932 Supervision of pregnancy with insufficient antenatal care, second trimester: Secondary | ICD-10-CM

## 2016-11-22 DIAGNOSIS — O0930 Supervision of pregnancy with insufficient antenatal care, unspecified trimester: Secondary | ICD-10-CM | POA: Insufficient documentation

## 2016-11-22 NOTE — Progress Notes (Signed)
Pt presents for BTL consult. No prenatal care. Pt presented for TAB but was told she was too far along by the Abortion Clinics. FHT's 154. +FM. Denies VB and denies abnormal discharge.

## 2016-11-22 NOTE — Patient Instructions (Signed)
Second Trimester of Pregnancy The second trimester is from week 13 through week 28 (months 4 through 6). The second trimester is often a time when you feel your best. Your body has also adjusted to being pregnant, and you begin to feel better physically. Usually, morning sickness has lessened or quit completely, you may have more energy, and you may have an increase in appetite. The second trimester is also a time when the fetus is growing rapidly. At the end of the sixth month, the fetus is about 9 inches long and weighs about 1 pounds. You will likely begin to feel the baby move (quickening) between 18 and 20 weeks of the pregnancy. Body changes during your second trimester Your body continues to go through many changes during your second trimester. The changes vary from woman to woman.  Your weight will continue to increase. You will notice your lower abdomen bulging out.  You may begin to get stretch marks on your hips, abdomen, and breasts.  You may develop headaches that can be relieved by medicines. The medicines should be approved by your health care provider.  You may urinate more often because the fetus is pressing on your bladder.  You may develop or continue to have heartburn as a result of your pregnancy.  You may develop constipation because certain hormones are causing the muscles that push waste through your intestines to slow down.  You may develop hemorrhoids or swollen, bulging veins (varicose veins).  You may have back pain. This is caused by:  Weight gain.  Pregnancy hormones that are relaxing the joints in your pelvis.  A shift in weight and the muscles that support your balance.  Your breasts will continue to grow and they will continue to become tender.  Your gums may bleed and may be sensitive to brushing and flossing.  Dark spots or blotches (chloasma, mask of pregnancy) may develop on your face. This will likely fade after the baby is born.  A dark line  from your belly button to the pubic area (linea nigra) may appear. This will likely fade after the baby is born.  You may have changes in your hair. These can include thickening of your hair, rapid growth, and changes in texture. Some women also have hair loss during or after pregnancy, or hair that feels dry or thin. Your hair will most likely return to normal after your baby is born. What to expect at prenatal visits During a routine prenatal visit:  You will be weighed to make sure you and the fetus are growing normally.  Your blood pressure will be taken.  Your abdomen will be measured to track your baby's growth.  The fetal heartbeat will be listened to.  Any test results from the previous visit will be discussed. Your health care provider may ask you:  How you are feeling.  If you are feeling the baby move.  If you have had any abnormal symptoms, such as leaking fluid, bleeding, severe headaches, or abdominal cramping.  If you are using any tobacco products, including cigarettes, chewing tobacco, and electronic cigarettes.  If you have any questions. Other tests that may be performed during your second trimester include:  Blood tests that check for:  Low iron levels (anemia).  Gestational diabetes (between 24 and 28 weeks).  Rh antibodies. This is to check for a protein on red blood cells (Rh factor).  Urine tests to check for infections, diabetes, or protein in the urine.  An ultrasound to   confirm the proper growth and development of the baby.  An amniocentesis to check for possible genetic problems.  Fetal screens for spina bifida and Down syndrome.  HIV (human immunodeficiency virus) testing. Routine prenatal testing includes screening for HIV, unless you choose not to have this test. Follow these instructions at home: Eating and drinking  Continue to eat regular, healthy meals.  Avoid raw meat, uncooked cheese, cat litter boxes, and soil used by cats. These  carry germs that can cause birth defects in the baby.  Take your prenatal vitamins.  Take 1500-2000 mg of calcium daily starting at the 20th week of pregnancy until you deliver your baby.  If you develop constipation:  Take over-the-counter or prescription medicines.  Drink enough fluid to keep your urine clear or pale yellow.  Eat foods that are high in fiber, such as fresh fruits and vegetables, whole grains, and beans.  Limit foods that are high in fat and processed sugars, such as fried and sweet foods. Activity  Exercise only as directed by your health care provider. Experiencing uterine cramps is a good sign to stop exercising.  Avoid heavy lifting, wear low heel shoes, and practice good posture.  Wear your seat belt at all times when driving.  Rest with your legs elevated if you have leg cramps or low back pain.  Wear a good support bra for breast tenderness.  Do not use hot tubs, steam rooms, or saunas. Lifestyle  Avoid all smoking, herbs, alcohol, and unprescribed drugs. These chemicals affect the formation and growth of the baby.  Do not use any products that contain nicotine or tobacco, such as cigarettes and e-cigarettes. If you need help quitting, ask your health care provider.  A sexual relationship may be continued unless your health care provider directs you otherwise. General instructions  Follow your health care provider's instructions regarding medicine use. There are medicines that are either safe or unsafe to take during pregnancy.  Take warm sitz baths to soothe any pain or discomfort caused by hemorrhoids. Use hemorrhoid cream if your health care provider approves.  If you develop varicose veins, wear support hose. Elevate your feet for 15 minutes, 3-4 times a day. Limit salt in your diet.  Visit your dentist if you have not gone yet during your pregnancy. Use a soft toothbrush to brush your teeth and be gentle when you floss.  Keep all follow-up  prenatal visits as told by your health care provider. This is important. Contact a health care provider if:  You have dizziness.  You have mild pelvic cramps, pelvic pressure, or nagging pain in the abdominal area.  You have persistent nausea, vomiting, or diarrhea.  You have a bad smelling vaginal discharge.  You have pain with urination. Get help right away if:  You have a fever.  You are leaking fluid from your vagina.  You have spotting or bleeding from your vagina.  You have severe abdominal cramping or pain.  You have rapid weight gain or weight loss.  You have shortness of breath with chest pain.  You notice sudden or extreme swelling of your face, hands, ankles, feet, or legs.  You have not felt your baby move in over an hour.  You have severe headaches that do not go away with medicine.  You have vision changes. Summary  The second trimester is from week 13 through week 28 (months 4 through 6). It is also a time when the fetus is growing rapidly.  Your body goes   through many changes during pregnancy. The changes vary from woman to woman.  Avoid all smoking, herbs, alcohol, and unprescribed drugs. These chemicals affect the formation and growth your baby.  Do not use any tobacco products, such as cigarettes, chewing tobacco, and e-cigarettes. If you need help quitting, ask your health care provider.  Contact your health care provider if you have any questions. Keep all prenatal visits as told by your health care provider. This is important. This information is not intended to replace advice given to you by your health care provider. Make sure you discuss any questions you have with your health care provider. Document Released: 09/12/2001 Document Revised: 02/24/2016 Document Reviewed: 11/19/2012 Elsevier Interactive Patient Education  2017 [employer] Inc[employer]  

## 2016-11-22 NOTE — Progress Notes (Signed)
Subjective:     Patient ID: Ann DohenyAshli J Hugh, female   DOB: [birth year], 26 y.o.   MRN: [id number] Cc; scheduled to discuss contraception [id number] 1877w0d She was too far along for an abortion, came today to discuss contraception, [facility]   Review of Systems  Constitutional: Negative.   Gastrointestinal: Negative.   Genitourinary: Negative.        Objective:   Physical Exam  Constitutional: She appears well-developed. No distress.  Cardiovascular: Normal rate.   Abdominal:  Gravid 24 weeks size  Psychiatric: She has a normal mood and affect. Her behavior is normal.   Blood pressure (!) 89/56, pulse (!) 102, height 5\' 2"  (1.575 m), weight 116 lb (52.6 kg), last menstrual period 08/16/2016, unknown if currently breastfeeding.     Assessment:     3577w0d [id number] Late prenatal care    Plan:     Prenatal panel, MFM complete [place], RTC for NOB  Adam PhenixJames G Arnold, MD 11/22/2016

## 2016-11-23 LAB — PRENATAL PROFILE I(LABCORP)
Antibody Screen: NEGATIVE
Basophils Absolute: 0 10*3/uL (ref 0.0–0.2)
Basos: 0 %
EOS (ABSOLUTE): 0.1 10*3/uL (ref 0.0–0.4)
EOS: 1 %
HEMOGLOBIN: 10 g/dL — AB (ref 11.1–15.9)
HEP B S AG: NEGATIVE
Hematocrit: 28.7 % — ABNORMAL LOW (ref 34.0–46.6)
IMMATURE GRANULOCYTES: 0 %
Immature Grans (Abs): 0 10*3/uL (ref 0.0–0.1)
LYMPHS: 15 %
Lymphocytes Absolute: 1.2 10*3/uL (ref 0.7–3.1)
MCH: 33.2 pg — ABNORMAL HIGH (ref 26.6–33.0)
MCHC: 34.8 g/dL (ref 31.5–35.7)
MCV: 95 fL (ref 79–97)
MONOCYTES: 5 %
MONOS ABS: 0.4 10*3/uL (ref 0.1–0.9)
NEUTROS PCT: 79 %
Neutrophils Absolute: 6.2 10*3/uL (ref 1.4–7.0)
Platelets: 195 10*3/uL (ref 150–379)
RBC: 3.01 x10E6/uL — ABNORMAL LOW (ref 3.77–5.28)
RDW: 14.9 % (ref 12.3–15.4)
RH TYPE: POSITIVE
RPR: NONREACTIVE
RUBELLA: 1.56 {index} (ref >0.99)
WBC: 7.9 10*3/uL (ref 3.4–10.8)

## 2016-11-23 LAB — HIV ANTIBODY (ROUTINE TESTING W REFLEX): HIV Screen 4th Generation wRfx: NONREACTIVE

## 2016-11-23 LAB — URINE CYTOLOGY ANCILLARY ONLY
CHLAMYDIA, DNA PROBE: NEGATIVE
Neisseria Gonorrhea: NEGATIVE

## 2016-11-23 LAB — HEMOGLOBINOPATHY EVALUATION
HGB C: 0 %
HGB S: 0 %
HGB VARIANT: 0 %
Hemoglobin A2 Quantitation: 2.4 % (ref 1.8–3.2)
Hemoglobin F Quantitation: 0 % (ref 0.0–2.0)
Hgb A: 97.6 % (ref 96.4–98.8)

## 2016-11-24 ENCOUNTER — Ambulatory Visit (HOSPITAL_COMMUNITY)
Admission: RE | Admit: 2016-11-24 | Discharge: 2016-11-24 | Disposition: A | Discharge status: Home / Self Care | Payer: Medicaid Other | Source: Ambulatory Visit | Attending: Obstetrics & Gynecology | Admitting: Obstetrics & Gynecology

## 2016-11-24 ENCOUNTER — Other Ambulatory Visit: Payer: Self-pay | Admitting: Obstetrics & Gynecology

## 2016-11-24 DIAGNOSIS — Z3A25 25 weeks gestation of pregnancy: Secondary | ICD-10-CM | POA: Insufficient documentation

## 2016-11-24 DIAGNOSIS — Z3492 Encounter for supervision of normal pregnancy, unspecified, second trimester: Secondary | ICD-10-CM

## 2016-11-24 DIAGNOSIS — O0932 Supervision of pregnancy with insufficient antenatal care, second trimester: Secondary | ICD-10-CM | POA: Diagnosis not present

## 2016-11-24 DIAGNOSIS — O0930 Supervision of pregnancy with insufficient antenatal care, unspecified trimester: Secondary | ICD-10-CM | POA: Diagnosis present

## 2016-11-24 DIAGNOSIS — Z3687 Encounter for antenatal screening for uncertain dates: Secondary | ICD-10-CM | POA: Diagnosis not present

## 2016-11-25 LAB — URINE CULTURE, OB REFLEX

## 2016-11-25 LAB — CULTURE, OB URINE

## 2016-12-06 ENCOUNTER — Encounter: Payer: Self-pay | Admitting: Obstetrics & Gynecology

## 2016-12-06 ENCOUNTER — Encounter: Payer: Medicaid Other | Admitting: Obstetrics & Gynecology

## 2016-12-06 ENCOUNTER — Ambulatory Visit (INDEPENDENT_AMBULATORY_CARE_PROVIDER_SITE_OTHER): Payer: Medicaid Other | Admitting: Obstetrics & Gynecology

## 2016-12-06 ENCOUNTER — Other Ambulatory Visit (HOSPITAL_COMMUNITY)
Admission: RE | Admit: 2016-12-06 | Discharge: 2016-12-06 | Disposition: A | Discharge status: Home / Self Care | Payer: Medicaid Other | Source: Ambulatory Visit | Attending: Obstetrics & Gynecology | Admitting: Obstetrics & Gynecology

## 2016-12-06 DIAGNOSIS — Z01411 Encounter for gynecological examination (general) (routine) with abnormal findings: Secondary | ICD-10-CM | POA: Insufficient documentation

## 2016-12-06 DIAGNOSIS — O0930 Supervision of pregnancy with insufficient antenatal care, unspecified trimester: Secondary | ICD-10-CM

## 2016-12-06 DIAGNOSIS — O0932 Supervision of pregnancy with insufficient antenatal care, second trimester: Secondary | ICD-10-CM | POA: Diagnosis not present

## 2016-12-06 MED ORDER — PRENATAL VITAMINS 0.8 MG PO TABS: 1.0000 | ORAL_TABLET | Freq: Every day | ORAL | 12 refills | Status: DC

## 2016-12-06 NOTE — Progress Notes (Signed)
  Subjective:    Ann Santiago is a [id number] 7613w4d being seen today for her first obstetrical visit.  Her obstetrical history is significant for late prenatal care. Patient does intend to breast feed. Pregnancy history fully reviewed.  Patient reports no complaints.  Vitals:   12/06/16 1502  BP: 98/66  Pulse: 77  Temp: (!) 96.8 F (36 C)  Weight: 119 lb (54 kg)    HISTORY: OB History  Gravida Para Term Preterm AB Living  4 3 3     3   SAB TAB Ectopic Multiple Live Births        0 3    # Outcome Date GA Lbr Len/2nd Weight Sex Delivery Anes PTL Lv  4 Current           3 Term 04/13/16 9126w6d 05:56 / 00:16 6 lb 6.8 oz (2.914 kg) M Vag-Spont EPI  LIV  2 Term 05/05/12 5442w3d 06:30 / 00:05 6 lb 8.4 oz (2.96 kg) F Vag-Spont None  LIV  1 Term 2011 3251w0d  5 lb 11 oz (2.58 kg) F Vag-Spont   LIV     Past Medical History:  Diagnosis Date  . Anemia   . Headache   . History of chlamydia   . No pertinent past medical history   . Vaginal Pap smear, abnormal    Past Surgical History:  Procedure Laterality Date  . WISDOM TOOTH EXTRACTION     Family History  Problem Relation Age of Onset  . Hypertension Maternal Grandmother      Exam    Uterus:     Pelvic Exam:    Perineum: No Hemorrhoids   Vulva: normal   Vagina:  normal mucosa   pH:     Cervix: no lesions   Adnexa: not evaluated   Bony Pelvis: average  System: Breast:  normal appearance, no masses or tenderness   Skin: normal coloration and turgor, no rashes    Neurologic: oriented, normal mood   Extremities: normal strength, tone, and muscle mass   HEENT neck supple with midline trachea   Mouth/Teeth dental hygiene good   Neck supple   Cardiovascular: regular rate and rhythm   Respiratory:  appears well, vitals normal, no respiratory distress, acyanotic, normal RR, neck free of mass or lymphadenopathy, chest clear, no wheezing, crepitations, rhonchi, normal symmetric air entry   Abdomen: gravid   Urinary: urethral  meatus normal      Assessment:    Pregnancy: [id number] Patient Active Problem List   Diagnosis Date Noted  . Late prenatal care, antepartum 11/22/2016        Plan:     Initial labs drawn. Prenatal vitamins. Problem list reviewed and updated. Genetic Screening too late.  Ultrasound discussed; fetal survey: results reviewed.  Follow up in 2 weeks. 50% of 30 min visit spent on counseling and coordination of care.  2 hr GTT next Plans BTL and signed papers today  Scheryl DarterJames Damontae Loppnow 12/06/2016

## 2016-12-08 LAB — CYTOLOGY - PAP: DIAGNOSIS: NEGATIVE

## 2016-12-25 ENCOUNTER — Ambulatory Visit (INDEPENDENT_AMBULATORY_CARE_PROVIDER_SITE_OTHER): Payer: Medicaid Other | Admitting: Obstetrics and Gynecology

## 2016-12-25 VITALS — BP 101/63 | HR 100 | Wt 121.4 lb

## 2016-12-25 DIAGNOSIS — O0933 Supervision of pregnancy with insufficient antenatal care, third trimester: Secondary | ICD-10-CM

## 2016-12-25 DIAGNOSIS — O0930 Supervision of pregnancy with insufficient antenatal care, unspecified trimester: Secondary | ICD-10-CM

## 2016-12-25 NOTE — Patient Instructions (Signed)
Third Trimester of Pregnancy The third trimester is from week 28 through week 40 (months 7 through 9). The third trimester is a time when the unborn baby (fetus) is growing rapidly. At the end of the ninth month, the fetus is about 20 inches in length and weighs 6-10 pounds. Body changes during your third trimester Your body will continue to go through many changes during pregnancy. The changes vary from woman to woman. During the third trimester:  Your weight will continue to increase. You can expect to gain 25-35 pounds (11-16 kg) by the end of the pregnancy.  You may begin to get stretch marks on your hips, abdomen, and breasts.  You may urinate more often because the fetus is moving lower into your pelvis and pressing on your bladder.  You may develop or continue to have heartburn. This is caused by increased hormones that slow down muscles in the digestive tract.  You may develop or continue to have constipation because increased hormones slow digestion and cause the muscles that push waste through your intestines to relax.  You may develop hemorrhoids. These are swollen veins (varicose veins) in the rectum that can itch or be painful.  You may develop swollen, bulging veins (varicose veins) in your legs.  You may have increased body aches in the pelvis, back, or thighs. This is due to weight gain and increased hormones that are relaxing your joints.  You may have changes in your hair. These can include thickening of your hair, rapid growth, and changes in texture. Some women also have hair loss during or after pregnancy, or hair that feels dry or thin. Your hair will most likely return to normal after your baby is born.  Your breasts will continue to grow and they will continue to become tender. A yellow fluid (colostrum) may leak from your breasts. This is the first milk you are producing for your baby.  Your belly button may stick out.  You may notice more swelling in your hands,  face, or ankles.  You may have increased tingling or numbness in your hands, arms, and legs. The skin on your belly may also feel numb.  You may feel short of breath because of your expanding uterus.  You may have more problems sleeping. This can be caused by the size of your belly, increased need to urinate, and an increase in your body's metabolism.  You may notice the fetus "dropping," or moving lower in your abdomen (lightening).  You may have increased vaginal discharge.  You may notice your joints feel loose and you may have pain around your pelvic bone. What to expect at prenatal visits You will have prenatal exams every 2 weeks until week 36. Then you will have weekly prenatal exams. During a routine prenatal visit:  You will be weighed to make sure you and the baby are growing normally.  Your blood pressure will be taken.  Your abdomen will be measured to track your baby's growth.  The fetal heartbeat will be listened to.  Any test results from the previous visit will be discussed.  You may have a cervical check near your due date to see if your cervix has softened or thinned (effaced).  You will be tested for Group B streptococcus. This happens between 35 and 37 weeks. Your health care provider may ask you:  What your birth plan is.  How you are feeling.  If you are feeling the baby move.  If you have had any abnormal   symptoms, such as leaking fluid, bleeding, severe headaches, or abdominal cramping.  If you are using any tobacco products, including cigarettes, chewing tobacco, and electronic cigarettes.  If you have any questions. Other tests or screenings that may be performed during your third trimester include:  Blood tests that check for low iron levels (anemia).  Fetal testing to check the health, activity level, and growth of the fetus. Testing is done if you have certain medical conditions or if there are problems during the pregnancy.  Nonstress test  (NST). This test checks the health of your baby to make sure there are no signs of problems, such as the baby not getting enough oxygen. During this test, a belt is placed around your belly. The baby is made to move, and its heart rate is monitored during movement. What is false labor? False labor is a condition in which you feel small, irregular tightenings of the muscles in the womb (contractions) that usually go away with rest, changing position, or drinking water. These are called Braxton Hicks contractions. Contractions may last for hours, days, or even weeks before true labor sets in. If contractions come at regular intervals, become more frequent, increase in intensity, or become painful, you should see your health care provider. What are the signs of labor?  Abdominal cramps.  Regular contractions that start at 10 minutes apart and become stronger and more frequent with time.  Contractions that start on the top of the uterus and spread down to the lower abdomen and back.  Increased pelvic pressure and dull back pain.  A watery or bloody mucus discharge that comes from the vagina.  Leaking of amniotic fluid. This is also known as your "water breaking." It could be a slow trickle or a gush. Let your health care provider know if it has a color or strange odor. If you have any of these signs, call your health care provider right away, even if it is before your due date. Follow these instructions at home: Medicines   Follow your health care provider's instructions regarding medicine use. Specific medicines may be either safe or unsafe to take during pregnancy.  Take a prenatal vitamin that contains at least 600 micrograms (mcg) of folic acid.  If you develop constipation, try taking a stool softener if your health care provider approves. Eating and drinking   Eat a balanced diet that includes fresh fruits and vegetables, whole grains, good sources of protein such as meat, eggs, or tofu,  and low-fat dairy. Your health care provider will help you determine the amount of weight gain that is right for you.  Avoid raw meat and uncooked cheese. These carry germs that can cause birth defects in the baby.  If you have low calcium intake from food, talk to your health care provider about whether you should take a daily calcium supplement.  Eat four or five small meals rather than three large meals a day.  Limit foods that are high in fat and processed sugars, such as fried and sweet foods.  To prevent constipation:  Drink enough fluid to keep your urine clear or pale yellow.  Eat foods that are high in fiber, such as fresh fruits and vegetables, whole grains, and beans. Activity   Exercise only as directed by your health care provider. Most women can continue their usual exercise routine during pregnancy. Try to exercise for 30 minutes at least 5 days a week. Stop exercising if you experience uterine contractions.  Avoid heavy lifting.  lifting.  Do not exercise in extreme heat or humidity, or at high altitudes.  Wear low-heel, comfortable shoes.  Practice good posture.  You may continue to have sex unless your health care provider tells you otherwise. Relieving pain and discomfort   Take frequent breaks and rest with your legs elevated if you have leg cramps or low back pain.  Take warm sitz baths to soothe any pain or discomfort caused by hemorrhoids. Use hemorrhoid cream if your health care provider approves.  Wear a good support bra to prevent discomfort from breast tenderness.  If you develop varicose veins:  Wear support pantyhose or compression stockings as told by your healthcare provider.  Elevate your feet for 15 minutes, 3-4 times a day. Prenatal care   Write down your questions. Take them to your prenatal visits.  Keep all your prenatal visits as told by your health care provider. This is important. Safety   Wear your seat belt  at all times when driving.  Make a list of emergency phone numbers, including numbers for family, friends, the hospital, and police and fire departments. General instructions   Avoid cat litter boxes and soil used by cats. These carry germs that can cause birth defects in the baby. If you have a cat, ask someone to clean the litter box for you.  Do not travel far distances unless it is absolutely necessary and only with the approval of your health care provider.  Do not use hot tubs, steam rooms, or saunas.  Do not drink alcohol.  Do not use any products that contain nicotine or tobacco, such as cigarettes and e-cigarettes. If you need help quitting, ask your health care provider.  Do not use any medicinal herbs or unprescribed drugs. These chemicals affect the formation and growth of the baby.  Do not douche or use tampons or scented sanitary pads.  Do not cross your legs for long periods of time.  To prepare for the arrival of your baby:  Take prenatal classes to understand, practice, and ask questions about labor and delivery.  Make a trial run to the hospital.  Visit the hospital and tour the maternity area.  Arrange for maternity or paternity leave through employers.  Arrange for family and friends to take care of pets while you are in the hospital.  Purchase a rear-facing car seat and make sure you know how to install it in your car.  Pack your hospital bag.  Prepare the baby's nursery. Make sure to remove all pillows and stuffed animals from the baby's crib to prevent suffocation.  Visit your dentist if you have not gone during your pregnancy. Use a soft toothbrush to brush your teeth and be gentle when you floss. Contact a health care provider if:  You are unsure if you are in labor or if your water has broken.  You become dizzy.  You have mild pelvic cramps, pelvic pressure, or nagging pain in your abdominal area.  You have lower back pain.  You have  persistent nausea, vomiting, or diarrhea.  You have an unusual or bad smelling vaginal discharge.  You have pain when you urinate. Get help right away if:  Your water breaks before 37 weeks.  You have regular contractions less than 5 minutes apart before 37 weeks.  You have a fever.  You are leaking fluid from your vagina.  You have spotting or bleeding from your vagina.  You have severe abdominal pain or cramping.  You have rapid weight   weight gain.  You have shortness of breath with chest pain.  You notice sudden or extreme swelling of your face, hands, ankles, feet, or legs.  Your baby makes fewer than 10 movements in 2 hours.  You have severe headaches that do not go away when you take medicine.  You have vision changes. Summary  The third trimester is from week 28 through week 40, months 7 through 9. The third trimester is a time when the unborn baby (fetus) is growing rapidly.  During the third trimester, your discomfort may increase as you and your baby continue to gain weight. You may have abdominal, leg, and back pain, sleeping problems, and an increased need to urinate.  During the third trimester your breasts will keep growing and they will continue to become tender. A yellow fluid (colostrum) may leak from your breasts. This is the first milk you are producing for your baby.  False labor is a condition in which you feel small, irregular tightenings of the muscles in the womb (contractions) that eventually go away. These are called Braxton Hicks contractions. Contractions may last for hours, days, or even weeks before true labor sets in.  Signs of labor can include: abdominal cramps; regular contractions that start at 10 minutes apart and become stronger and more frequent with time; watery or bloody mucus discharge that comes from the vagina; increased pelvic pressure and dull back pain; and leaking of amniotic fluid. This information is not intended to replace advice given  to you by your health care provider. Make sure you discuss any questions you have with your health care provider. Document Released: 09/12/2001 Document Revised: 02/24/2016 Document Reviewed: 11/19/2012 Elsevier Interactive Patient Education  2017 [employer].   Preterm Labor and Birth Information The normal length of a pregnancy is 39-41 weeks. Preterm labor is when labor starts before 37 completed weeks of pregnancy. What are the risk factors for preterm labor? Preterm labor is more likely to occur in women who:  Have certain infections during pregnancy such as a bladder infection, sexually transmitted infection, or infection inside the uterus (chorioamnionitis).  Have a shorter-than-normal cervix.  Have gone into preterm labor before.  Have had surgery on their cervix.  Are younger than age 94 or older than age 64.  Are African American.  Are pregnant with twins or multiple babies (multiple gestation).  Take street drugs or smoke while pregnant.  Do not gain enough weight while pregnant.  Became pregnant shortly after having been pregnant. What are the symptoms of preterm labor? Symptoms of preterm labor include:  Cramps similar to those that can happen during a menstrual period. The cramps may happen with diarrhea.  Pain in the abdomen or lower back.  Regular uterine contractions that may feel like tightening of the abdomen.  A feeling of increased pressure in the pelvis.  Increased watery or bloody mucus discharge from the vagina.  Water breaking (ruptured amniotic sac). Why is it important to recognize signs of preterm labor? It is important to recognize signs of preterm labor because babies who are born prematurely may not be fully developed. This can put them at an increased risk for:  Long-term (chronic) heart and lung problems.  Difficulty immediately after birth with regulating body systems, including blood sugar, body temperature, heart rate, and  breathing rate.  Bleeding in the brain.  Cerebral palsy.  Learning difficulties.  Death. These risks are highest for babies who are born before 34 weeks of pregnancy. How is preterm labor  treated? Treatment depends on the length of your pregnancy, your condition, and the health of your baby. It may involve:  Having a stitch (suture) placed in your cervix to prevent your cervix from opening too early (cerclage).  Taking or being given medicines, such as:  Hormone medicines. These may be given early in pregnancy to help support the pregnancy.  Medicine to stop contractions.  Medicines to help mature the baby's lungs. These may be prescribed if the risk of delivery is high.  Medicines to prevent your baby from developing cerebral palsy. If the labor happens before 34 weeks of pregnancy, you may need to stay in the hospital. What should I do if I think I am in preterm labor? If you think that you are going into preterm labor, call your health care provider right away. How can I prevent preterm labor in future pregnancies? To increase your chance of having a full-term pregnancy:  Do not use any tobacco products, such as cigarettes, chewing tobacco, and e-cigarettes. If you need help quitting, ask your health care provider.  Do not use street drugs or medicines that have not been prescribed to you during your pregnancy.  Talk with your health care provider before taking any herbal supplements, even if you have been taking them regularly.  Make sure you gain a healthy amount of weight during your pregnancy.  Watch for infection. If you think that you might have an infection, get it checked right away.  Make sure to tell your health care provider if you have gone into preterm labor before. This information is not intended to replace advice given to you by your health care provider. Make sure you discuss any questions you have with your health care provider. Document Released:  12/09/2003 Document Revised: 02/29/2016 Document Reviewed: 02/09/2016 Elsevier Interactive Patient Education  2017 [employer]

## 2016-12-25 NOTE — Progress Notes (Signed)
   PRENATAL VISIT NOTE  Subjective:  Ann Santiago is a 26 y.o. [phone] at 7377w2d being seen today for ongoing prenatal care.  She is currently monitored for the following issues for this low-risk pregnancy and has Late prenatal care, antepartum on her problem list.  Patient reports no complaints.  Contractions: Not present. Vag. Bleeding: None.  Movement: Present. Denies leaking of fluid.   The following portions of the patient's history were reviewed and updated as appropriate: allergies, current medications, past family history, past medical history, past social history, past surgical history and problem list. Problem list updated.  Objective:   Vitals:   12/25/16 1456  BP: 101/63  Pulse: 100  Weight: 121 lb 6.4 oz (55.1 kg)    Fetal Status: Fetal Heart Rate (bpm): 141 Fundal Height: 30 cm Movement: Present     General:  Alert, oriented and cooperative. Patient is in no acute distress.  Skin: Skin is warm and dry. No rash noted.   Cardiovascular: Normal heart rate noted  Respiratory: Normal respiratory effort, no problems with respiration noted  Abdomen: Soft, gravid, appropriate for gestational age. Pain/Pressure: Absent     Pelvic:  Cervical exam deferred        Extremities: Normal range of motion.  Edema: None  Mental Status: Normal mood and affect. Normal behavior. Normal judgment and thought content.   Assessment and Plan:  Pregnancy: G4P3003 at 4177w2d  1. Late prenatal care, antepartum Patient is doing well without complaints BTL forms signed again today Patient will return prior to next appointment for 2 hour glucola  Preterm labor symptoms and general obstetric precautions including but not limited to vaginal bleeding, contractions, leaking of fluid and fetal movement were reviewed in detail with the patient. Please refer to After Visit Summary for other counseling recommendations.  Return in about 2 weeks (around 01/08/2017) for ROB.   Catalina AntiguaPeggy Adrion Menz, MD

## 2017-01-08 ENCOUNTER — Other Ambulatory Visit: Payer: Medicaid Other

## 2017-01-08 ENCOUNTER — Encounter: Payer: Medicaid Other | Admitting: Certified Nurse Midwife

## 2017-01-22 ENCOUNTER — Ambulatory Visit (INDEPENDENT_AMBULATORY_CARE_PROVIDER_SITE_OTHER): Payer: Medicaid Other | Admitting: Obstetrics

## 2017-01-22 ENCOUNTER — Other Ambulatory Visit: Payer: Medicaid Other

## 2017-01-22 VITALS — BP 88/58 | HR 86 | Wt 124.0 lb

## 2017-01-22 DIAGNOSIS — O4703 False labor before 37 completed weeks of gestation, third trimester: Secondary | ICD-10-CM

## 2017-01-22 DIAGNOSIS — Z3480 Encounter for supervision of other normal pregnancy, unspecified trimester: Secondary | ICD-10-CM

## 2017-01-22 DIAGNOSIS — O4700 False labor before 37 completed weeks of gestation, unspecified trimester: Secondary | ICD-10-CM

## 2017-01-22 DIAGNOSIS — Z3483 Encounter for supervision of other normal pregnancy, third trimester: Secondary | ICD-10-CM

## 2017-01-22 DIAGNOSIS — O0993 Supervision of high risk pregnancy, unspecified, third trimester: Secondary | ICD-10-CM

## 2017-01-22 MED ORDER — CITRANATAL BLOOM 90-1 MG PO TABS: 1.0000 | ORAL_TABLET | Freq: Every day | ORAL | 11 refills | Status: DC

## 2017-01-22 NOTE — Patient Instructions (Addendum)

## 2017-01-22 NOTE — Progress Notes (Addendum)
Subjective:  Ann Santiago is a 26 y.o. [phone] at [redacted]w[redacted]d being seen today for ongoing prenatal care.  She is currently monitored for the following issues for this low-risk pregnancy and has Late prenatal care, antepartum on her problem list.  Patient reports backache, occasional contractions and pressure.  Has a craving for ice.   Contractions: Irregular. Vag. Bleeding: None.  Movement: Present. Denies leaking of fluid.   The following portions of the patient's history were reviewed and updated as appropriate: allergies, current medications, past family history, past medical history, past social history, past surgical history and problem list. Problem list updated.  Objective:   Vitals:   01/22/17 1000  BP: (!) 88/58  Pulse: 86  Weight: 124 lb (56.2 kg)    Fetal Status:     Movement: Present     General:  Alert, oriented and cooperative. Patient is in no acute distress.  Skin: Skin is warm and dry. No rash noted.   Cardiovascular: Normal heart rate noted  Respiratory: Normal respiratory effort, no problems with respiration noted  Abdomen: Soft, gravid, appropriate for gestational age. Pain/Pressure: Present     Pelvic:  Cervical exam deferred        Extremities: Normal range of motion.     Mental Status: Normal mood and affect. Normal behavior. Normal judgment and thought content.   Urinalysis:      Assessment and Plan:  Pregnancy: G4P3003 at [redacted]w[redacted]d :  1. Supervision of high risk pregnancy in third trimester Rx: - Glucose Tolerance, 2 Hours w/1 Hour - CBC - HIV antibody - RPR - Prenatal-DSS-FeCb-FeGl-FA (CITRANATAL BLOOM) 90-1 MG TABS; Take 1 tablet by mouth daily before breakfast.  Dispense: 30 tablet; Refill: 11   2. Backache, contractions and pelvic pressure Rx: - Maternity Belt Rx  3. Ice Pica.  Borderline Anemia Rx: - Citranatal Bloom Rx - Fusion Plus samples dispensed  Preterm labor symptoms and general obstetric precautions including but not limited to vaginal  bleeding, contractions, leaking of fluid and fetal movement were reviewed in detail with the patient. Please refer to After Visit Summary for other counseling recommendations.  Return in 1 week (on 01/29/2017).   Brock Bad, MD                                                                                                                                                                                                                             Pt states that she  lost mucous plug last night.  Pt states she is having increase pain.Patient ID: Ann Santiago, female   DOB: Nov 15, 1990, 26 y.o.   MRN: [id number]  Leonette Most A. Clearance Coots MD

## 2017-01-22 NOTE — Progress Notes (Signed)
See Nurse's Note  Ann Santiago A. Clearance Coots MD

## 2017-01-23 LAB — CBC
HEMATOCRIT: 27.2 % — AB (ref 34.0–46.6)
HEMOGLOBIN: 9.1 g/dL — AB (ref 11.1–15.9)
MCH: 31.7 pg (ref 26.6–33.0)
MCHC: 33.5 g/dL (ref 31.5–35.7)
MCV: 95 fL (ref 79–97)
Platelets: 170 10*3/uL (ref 150–379)
RBC: 2.87 x10E6/uL — AB (ref 3.77–5.28)
RDW: 13.3 % (ref 12.3–15.4)
WBC: 6.8 10*3/uL (ref 3.4–10.8)

## 2017-01-23 LAB — GLUCOSE TOLERANCE, 2 HOURS W/ 1HR
Glucose, 1 hour: 74 mg/dL (ref 65–179)
Glucose, 2 hour: 77 mg/dL (ref 65–152)
Glucose, Fasting: 64 mg/dL — ABNORMAL LOW (ref 65–91)

## 2017-01-23 LAB — HIV ANTIBODY (ROUTINE TESTING W REFLEX): HIV Screen 4th Generation wRfx: NONREACTIVE

## 2017-01-23 LAB — RPR: RPR Ser Ql: NONREACTIVE

## 2017-01-24 ENCOUNTER — Other Ambulatory Visit: Payer: Self-pay | Admitting: Obstetrics

## 2017-01-30 ENCOUNTER — Encounter: Payer: Self-pay | Admitting: Obstetrics

## 2017-01-30 ENCOUNTER — Encounter: Payer: Medicaid Other | Admitting: Obstetrics

## 2017-01-30 ENCOUNTER — Ambulatory Visit (INDEPENDENT_AMBULATORY_CARE_PROVIDER_SITE_OTHER): Payer: Self-pay | Admitting: Obstetrics

## 2017-01-30 VITALS — BP 100/65 | HR 99 | Wt 123.0 lb

## 2017-01-30 DIAGNOSIS — Z113 Encounter for screening for infections with a predominantly sexual mode of transmission: Secondary | ICD-10-CM

## 2017-01-30 DIAGNOSIS — Z3480 Encounter for supervision of other normal pregnancy, unspecified trimester: Secondary | ICD-10-CM

## 2017-01-30 DIAGNOSIS — Z3493 Encounter for supervision of normal pregnancy, unspecified, third trimester: Secondary | ICD-10-CM

## 2017-01-30 DIAGNOSIS — Z3490 Encounter for supervision of normal pregnancy, unspecified, unspecified trimester: Secondary | ICD-10-CM

## 2017-01-30 NOTE — Progress Notes (Signed)
Subjective:  Ann Santiago is a 26 y.o. [phone] at [redacted]w[redacted]d being seen today for ongoing prenatal care.  She is currently monitored for the following issues for this low-risk pregnancy and has Late prenatal care, antepartum on her problem list.  Patient reports pelvic pressure.  Contractions: Irregular. Vag. Bleeding: None.  Movement: Present. Denies leaking of fluid.   The following portions of the patient's history were reviewed and updated as appropriate: allergies, current medications, past family history, past medical history, past social history, past surgical history and problem list. Problem list updated.  Objective:   Vitals:   01/30/17 1338  BP: 100/65  Pulse: 99  Weight: 123 lb (55.8 kg)    Fetal Status: Fetal Heart Rate (bpm): 140   Movement: Present     General:  Alert, oriented and cooperative. Patient is in no acute distress.  Skin: Skin is warm and dry. No rash noted.   Cardiovascular: Normal heart rate noted  Respiratory: Normal respiratory effort, no problems with respiration noted  Abdomen: Soft, gravid, appropriate for gestational age. Pain/Pressure: Present     Pelvic:  pelvic pressure     Cvx:  Long / closed / -3 / Vtx  Extremities: Normal range of motion.     Mental Status: Normal mood and affect. Normal behavior. Normal judgment and thought content.   Urinalysis:      Assessment and Plan:  Pregnancy: G4P3003 at [redacted]w[redacted]d  1. Prenatal care, antepartum Rx: - Strep Gp B NAA - Cervicovaginal ancillary only  Preterm labor symptoms and general obstetric precautions including but not limited to vaginal bleeding, contractions, leaking of fluid and fetal movement were reviewed in detail with the patient. Please refer to After Visit Summary for other counseling recommendations.  Return in 1 week (on 02/06/2017).   Brock Bad, MDPatient ID: Ann Santiago, female   DOB: 12-09-1990, 26 y.o.   MRN: [id number]

## 2017-01-31 LAB — CERVICOVAGINAL ANCILLARY ONLY
CHLAMYDIA, DNA PROBE: NEGATIVE
Neisseria Gonorrhea: NEGATIVE

## 2017-02-01 ENCOUNTER — Encounter (HOSPITAL_COMMUNITY): Payer: Self-pay

## 2017-02-01 ENCOUNTER — Inpatient Hospital Stay (HOSPITAL_COMMUNITY)
Admission: AD | Admit: 2017-02-01 | Discharge: 2017-02-04 | DRG: 767 | Disposition: A | Discharge status: Home / Self Care | Payer: Medicaid Other | Source: Ambulatory Visit | Attending: Obstetrics and Gynecology | Admitting: Obstetrics and Gynecology

## 2017-02-01 DIAGNOSIS — Z302 Encounter for sterilization: Secondary | ICD-10-CM

## 2017-02-01 DIAGNOSIS — Z3493 Encounter for supervision of normal pregnancy, unspecified, third trimester: Secondary | ICD-10-CM | POA: Diagnosis present

## 2017-02-01 DIAGNOSIS — Z3A35 35 weeks gestation of pregnancy: Secondary | ICD-10-CM

## 2017-02-01 DIAGNOSIS — O42013 Preterm premature rupture of membranes, onset of labor within 24 hours of rupture, third trimester: Secondary | ICD-10-CM | POA: Diagnosis not present

## 2017-02-01 LAB — CBC
HEMATOCRIT: 28.7 % — AB (ref 36.0–46.0)
HEMOGLOBIN: 9.5 g/dL — AB (ref 12.0–15.0)
MCH: 31 pg (ref 26.0–34.0)
MCHC: 33.1 g/dL (ref 30.0–36.0)
MCV: 93.8 fL (ref 78.0–100.0)
Platelets: 172 10*3/uL (ref 150–400)
RBC: 3.06 MIL/uL — AB (ref 3.87–5.11)
RDW: 13.7 % (ref 11.5–15.5)
WBC: 11 10*3/uL — AB (ref 4.0–10.5)

## 2017-02-01 LAB — STREP GP B NAA: Strep Gp B NAA: NEGATIVE

## 2017-02-01 MED ORDER — SOD CITRATE-CITRIC ACID 500-334 MG/5ML PO SOLN
30.0000 mL | ORAL | Status: DC | PRN
  Administered 2017-02-02: 30 mL via ORAL
  Filled 2017-02-01: qty 15

## 2017-02-01 MED ORDER — ONDANSETRON HCL 4 MG/2ML IJ SOLN
4.0000 mg | Freq: Four times a day (QID) | INTRAMUSCULAR | Status: DC | PRN
  Administered 2017-02-02 (×2): 4 mg via INTRAVENOUS
  Filled 2017-02-01 (×2): qty 2

## 2017-02-01 MED ORDER — OXYTOCIN 40 UNITS IN LACTATED RINGERS INFUSION - SIMPLE MED
2.5000 [IU]/h | INTRAVENOUS | Status: DC
  Filled 2017-02-01: qty 1000

## 2017-02-01 MED ORDER — LIDOCAINE HCL (PF) 1 % IJ SOLN
30.0000 mL | INTRAMUSCULAR | Status: DC | PRN
  Filled 2017-02-01: qty 30

## 2017-02-01 MED ORDER — OXYCODONE-ACETAMINOPHEN 5-325 MG PO TABS
1.0000 | ORAL_TABLET | Freq: Once | ORAL | Status: AC
  Administered 2017-02-01: 1 via ORAL
  Filled 2017-02-01: qty 1

## 2017-02-01 MED ORDER — FLEET ENEMA 7-19 GM/118ML RE ENEM: 1.0000 | ENEMA | RECTAL | Status: DC | PRN

## 2017-02-01 MED ORDER — OXYTOCIN BOLUS FROM INFUSION: 500.0000 mL | Freq: Once | INTRAVENOUS | Status: DC

## 2017-02-01 MED ORDER — OXYTOCIN 40 UNITS IN LACTATED RINGERS INFUSION - SIMPLE MED: 2.5000 [IU]/h | INTRAVENOUS | Status: DC

## 2017-02-01 MED ORDER — PENICILLIN G POTASSIUM 5000000 UNITS IJ SOLR
5.0000 10*6.[IU] | Freq: Once | INTRAVENOUS | Status: DC
  Filled 2017-02-01: qty 5

## 2017-02-01 MED ORDER — LACTATED RINGERS IV SOLN: INTRAVENOUS | Status: DC

## 2017-02-01 MED ORDER — SOD CITRATE-CITRIC ACID 500-334 MG/5ML PO SOLN: 30.0000 mL | ORAL | Status: DC | PRN

## 2017-02-01 MED ORDER — FENTANYL CITRATE (PF) 100 MCG/2ML IJ SOLN
100.0000 ug | INTRAMUSCULAR | Status: DC | PRN
  Administered 2017-02-01 – 2017-02-02 (×3): 100 ug via INTRAVENOUS
  Administered 2017-02-02: 50 ug via INTRAVENOUS
  Administered 2017-02-02 (×2): 100 ug via INTRAVENOUS
  Administered 2017-02-02: 50 ug via INTRAVENOUS
  Administered 2017-02-02: 100 ug via INTRAVENOUS
  Filled 2017-02-01 (×7): qty 2

## 2017-02-01 MED ORDER — LACTATED RINGERS IV SOLN: 500.0000 mL | INTRAVENOUS | Status: DC | PRN

## 2017-02-01 MED ORDER — LIDOCAINE HCL (PF) 1 % IJ SOLN: 30.0000 mL | INTRAMUSCULAR | Status: DC | PRN

## 2017-02-01 MED ORDER — ACETAMINOPHEN 325 MG PO TABS: 650.0000 mg | ORAL_TABLET | ORAL | Status: DC | PRN

## 2017-02-01 MED ORDER — OXYCODONE-ACETAMINOPHEN 5-325 MG PO TABS: 1.0000 | ORAL_TABLET | ORAL | Status: DC | PRN

## 2017-02-01 MED ORDER — OXYTOCIN BOLUS FROM INFUSION
500.0000 mL | Freq: Once | INTRAVENOUS | Status: AC
  Administered 2017-02-02: 500 mL via INTRAVENOUS

## 2017-02-01 MED ORDER — PENICILLIN G POT IN DEXTROSE 60000 UNIT/ML IV SOLN
3.0000 10*6.[IU] | INTRAVENOUS | Status: DC
  Filled 2017-02-01: qty 50

## 2017-02-01 MED ORDER — BETAMETHASONE SOD PHOS & ACET 6 (3-3) MG/ML IJ SUSP
12.0000 mg | Freq: Once | INTRAMUSCULAR | Status: AC
  Administered 2017-02-01: 12 mg via INTRAMUSCULAR
  Filled 2017-02-01: qty 2

## 2017-02-01 MED ORDER — ONDANSETRON HCL 4 MG/2ML IJ SOLN: 4.0000 mg | Freq: Four times a day (QID) | INTRAMUSCULAR | Status: DC | PRN

## 2017-02-01 MED ORDER — OXYCODONE-ACETAMINOPHEN 5-325 MG PO TABS: 2.0000 | ORAL_TABLET | ORAL | Status: DC | PRN

## 2017-02-01 MED ORDER — LACTATED RINGERS IV SOLN
INTRAVENOUS | Status: DC
  Administered 2017-02-01 – 2017-02-02 (×4): via INTRAVENOUS

## 2017-02-01 NOTE — MAU Note (Signed)
Patient presents with ctx apart. Patient denies any LOF.Fetus active.

## 2017-02-01 NOTE — MAU Provider Note (Signed)
History     CSN: [id number]  Arrival date and time: 02/01/17 2016   First Provider Initiated Contact with Patient 02/01/17 2031      Chief Complaint  Patient presents with  . Pelvic Pain   Ann Santiago is a 26 y.o. [phone] at [redacted]w[redacted]d who presents today with "pain all over". She states that she has had this pain for about 3 weeks. She has been taking warm baths, and it has not helped. She reports normal fetal movement.    Pelvic Pain  The patient's primary symptoms include pelvic pain. The patient's pertinent negatives include no vaginal discharge. This is a new problem. Episode onset: "for the last 3 weeks" The problem occurs intermittently. The problem has been gradually worsening. Pain severity now: 10/10  The problem affects both sides. She is pregnant. Associated symptoms include abdominal pain, diarrhea (once at night on occasion. ), nausea and vomiting. Pertinent negatives include no chills, constipation, dysuria, fever, frequency or urgency. The vaginal discharge was normal. There has been no bleeding (had bleeding last week, but I didn't come to the hospital. Last week my mucous plug came out. ). Nothing aggravates the symptoms. She has tried warm baths and NSAIDs for the symptoms. The treatment provided no relief.     Past Medical History:  Diagnosis Date  . Anemia   . Headache   . History of chlamydia   . No pertinent past medical history   . Vaginal Pap smear, abnormal     Past Surgical History:  Procedure Laterality Date  . WISDOM TOOTH EXTRACTION      Family History  Problem Relation Age of Onset  . Hypertension Maternal Grandmother     Social History  Substance Use Topics  . Smoking status: Never Smoker  . Smokeless tobacco: Never Used  . Alcohol use No     Comment: social    Allergies: No Known Allergies  Prescriptions Prior to Admission  Medication Sig Dispense Refill Last Dose  . ferrous sulfate 325 (65 FE) MG EC tablet Take 325 mg by mouth 3  (three) times daily with meals.   Taking  . ibuprofen (ADVIL,MOTRIN) 600 MG tablet Take 1 tablet (600 mg total) by mouth every 6 (six) hours as needed. 30 tablet 0 Taking  . Prenatal Multivit-Min-Fe-FA (PRENATAL VITAMINS) 0.8 MG tablet Take 1 tablet by mouth daily. 30 tablet 12   . Prenatal-DSS-FeCb-FeGl-FA (CITRANATAL BLOOM) 90-1 MG TABS Take 1 tablet by mouth daily before breakfast. 30 tablet 11 Taking    Review of Systems  Constitutional: Negative for chills and fever.  Gastrointestinal: Positive for abdominal pain, diarrhea (once at night on occasion. ), nausea and vomiting. Negative for constipation.  Genitourinary: Positive for pelvic pain. Negative for dysuria, frequency, urgency, vaginal bleeding and vaginal discharge.   Physical Exam   Blood pressure (!) 107/59, pulse (!) 111, temperature 98.1 F (36.7 C), temperature source Oral, resp. rate (!) 24, last menstrual period 08/16/2016, SpO2 100 %, unknown if currently breastfeeding.  Physical Exam  Nursing note and vitals reviewed. Constitutional: She is oriented to person, place, and time. She appears well-developed and well-nourished. No distress.  HENT:  Head: Normocephalic.  Cardiovascular: Normal rate.   Respiratory: Effort normal.  GI: Soft. There is no tenderness. There is no rebound.  Neurological: She is alert and oriented to person, place, and time.  Skin: Skin is warm and dry.  Psychiatric: She has a normal mood and affect.   FHT: 125, moderate with 15x15 accels, no  decels Toco: about every 3-4 mins  MAU Course  Procedures  MDM Patient was 3.5, no 5cm   Assessment and Plan  Preterm labor Admit to labor and delivery  Ann BourgeoisMarie Santiago, CNM notified   Thressa ShellerHeather Santiago 02/01/2017, 8:33 PM

## 2017-02-01 NOTE — H&P (Signed)
Ann Santiago is a 26 y.o. female presenting for labor/preterm labor Per RN, patient is contracting regularly and changed her cervix from 3.5cm to 5cm  Other babies were born at term  . OB History    Gravida Para Term Preterm AB Living   4 3 3     3    SAB TAB Ectopic Multiple Live Births         0 3     Past Medical History:  Diagnosis Date  . Anemia   . Headache   . History of chlamydia   . No pertinent past medical history   . Preterm labor 02/01/2017  . Vaginal Pap smear, abnormal    Past Surgical History:  Procedure Laterality Date  . WISDOM TOOTH EXTRACTION     Family History: family history includes Hypertension in her maternal grandmother. Social History:  reports that she has never smoked. She has never used smokeless tobacco. She reports that she does not drink alcohol or use drugs.     Maternal Diabetes: No Genetic Screening: Normal Maternal Ultrasounds/Referrals: Normal Fetal Ultrasounds or other Referrals:  None Maternal Substance Abuse:  No Significant Maternal Medications:  None Significant Maternal Lab Results:  GBS Neg Other Comments:  None  Review of Systems  Constitutional: Negative for chills, fever and malaise/fatigue.  Respiratory: Negative for shortness of breath.   Cardiovascular: Negative for leg swelling.  Gastrointestinal: Positive for abdominal pain. Negative for constipation, diarrhea, nausea and vomiting.  Genitourinary: Negative for dysuria.  Musculoskeletal: Positive for back pain.   Maternal Medical History:  Reason for admission: Contractions.  Nausea.  Contractions: Onset was 1-2 hours ago.   Frequency: regular.   Perceived severity is moderate.    Fetal activity: Perceived fetal activity is normal.   Last perceived fetal movement was within the past hour.    Prenatal complications: Preterm labor.   No bleeding, PIH, infection, oligohydramnios or placental abnormality.   Prenatal Complications - Diabetes:  none.    Dilation: 5 Effacement (%): 70 Station: -2 Exam by:: Kayren EavesAshley Garvey RN  Blood pressure 111/65, pulse 86, temperature 97.8 F (36.6 C), temperature source Oral, resp. rate 18, height 5' (1.524 m), weight 124 lb (56.2 kg), last menstrual period 08/16/2016, SpO2 100 %, unknown if currently breastfeeding. Maternal Exam:  Uterine Assessment: Contraction strength is moderate.  Contraction frequency is regular.   Abdomen: Patient reports no abdominal tenderness. Fundal height is 35.   Fetal presentation: vertex  Introitus: Normal vulva. Normal vagina.  Ferning test: not done.  Nitrazine test: not done. Amniotic fluid character: not assessed.  Pelvis: adequate for delivery.   Cervix: Cervix evaluated by digital exam.     Fetal Exam Fetal Monitor Review: Mode: ultrasound.   Baseline rate: 140.  Variability: moderate (6-25 bpm).   Pattern: accelerations present and no decelerations.    Fetal State Assessment: Category I - tracings are normal.     Physical Exam  Constitutional: She is oriented to person, place, and time. She appears well-developed and well-nourished. No distress.  HENT:  Head: Normocephalic.  Cardiovascular: Normal rate and regular rhythm.   Respiratory: Effort normal. No respiratory distress.  GI: Soft. She exhibits no distension. There is no tenderness. There is no rebound and no guarding.  Musculoskeletal: Normal range of motion.  Neurological: She is alert and oriented to person, place, and time.  Skin: Skin is warm and dry.  Psychiatric: She has a normal mood and affect.    Prenatal labs: ABO, Rh: O/Positive/-- (02/21  1659) Antibody: Negative (02/21 1659) Rubella: 1.56 (02/21 1659) RPR: Non Reactive (04/23 1140)  HBsAg: Negative (02/21 1659)  HIV: Non Reactive (04/23 1140)  GBS: Negative (05/01 1429)   Assessment/Plan: Single IUP at [redacted]w[redacted]d Preterm uterine contractions Cervical change over time  Admit to [facility] Routine  orders Betamethasone GBS Neg   Wynelle Bourgeois 02/01/2017, 10:24 PM

## 2017-02-01 NOTE — Progress Notes (Signed)
Patient ID: Ann DohenyAshli J Santiago, female   DOB: 1991-08-15, 26 y.o.   MRN: [id number] Appears comfortable but states back hurts a lot with contractions  Vitals:   02/01/17 1900 02/01/17 2150 02/01/17 2157  BP: (!) 107/59 111/65   Pulse: (!) 111 86   Resp: (!) 24 18   Temp: 98.1 F (36.7 C) 97.8 F (36.6 C)   TempSrc: Oral Oral   SpO2: 100%    Weight:   124 lb (56.2 kg)  Height:   5' (1.524 m)   FHR reactive UCs every 3-4 min  Cervix is 4cm by my exam 70%/-2/vertex  Will reassess cervix in 2 hours to determine whether or not this is indeed labor

## 2017-02-02 ENCOUNTER — Encounter (HOSPITAL_COMMUNITY)
Admission: AD | Disposition: A | Discharge status: Home / Self Care | Payer: Self-pay | Source: Ambulatory Visit | Attending: Obstetrics and Gynecology

## 2017-02-02 ENCOUNTER — Inpatient Hospital Stay (HOSPITAL_COMMUNITY): Payer: Medicaid Other | Admitting: Anesthesiology

## 2017-02-02 ENCOUNTER — Encounter: Payer: Self-pay | Admitting: Obstetrics and Gynecology

## 2017-02-02 DIAGNOSIS — Z3A35 35 weeks gestation of pregnancy: Secondary | ICD-10-CM

## 2017-02-02 DIAGNOSIS — Z302 Encounter for sterilization: Secondary | ICD-10-CM

## 2017-02-02 DIAGNOSIS — O42013 Preterm premature rupture of membranes, onset of labor within 24 hours of rupture, third trimester: Secondary | ICD-10-CM

## 2017-02-02 HISTORY — PX: TUBAL LIGATION: SHX77

## 2017-02-02 LAB — TYPE AND SCREEN
ABO/RH(D): O POS
ANTIBODY SCREEN: NEGATIVE

## 2017-02-02 LAB — RPR: RPR Ser Ql: NONREACTIVE

## 2017-02-02 SURGERY — LIGATION, FALLOPIAN TUBE, BILATERAL
Anesthesia: Spinal

## 2017-02-02 SURGERY — LIGATION, FALLOPIAN TUBE, POSTPARTUM
Anesthesia: Spinal | Site: Abdomen | Laterality: Bilateral

## 2017-02-02 MED ORDER — IBUPROFEN 600 MG PO TABS
600.0000 mg | ORAL_TABLET | Freq: Four times a day (QID) | ORAL | Status: DC
  Administered 2017-02-02 – 2017-02-04 (×7): 600 mg via ORAL
  Filled 2017-02-02 (×7): qty 1

## 2017-02-02 MED ORDER — DIPHENHYDRAMINE HCL 25 MG PO CAPS: 25.0000 mg | ORAL_CAPSULE | Freq: Four times a day (QID) | ORAL | Status: DC | PRN

## 2017-02-02 MED ORDER — ONDANSETRON HCL 4 MG/2ML IJ SOLN
INTRAMUSCULAR | Status: DC | PRN
  Administered 2017-02-02: 4 mg via INTRAVENOUS

## 2017-02-02 MED ORDER — PRENATAL MULTIVITAMIN CH
1.0000 | ORAL_TABLET | Freq: Every day | ORAL | Status: DC
  Administered 2017-02-03 – 2017-02-04 (×2): 1 via ORAL
  Filled 2017-02-02 (×2): qty 1

## 2017-02-02 MED ORDER — ONDANSETRON HCL 4 MG/2ML IJ SOLN: 4.0000 mg | INTRAMUSCULAR | Status: DC | PRN

## 2017-02-02 MED ORDER — SENNOSIDES-DOCUSATE SODIUM 8.6-50 MG PO TABS
2.0000 | ORAL_TABLET | ORAL | Status: DC
  Administered 2017-02-02 – 2017-02-04 (×2): 2 via ORAL
  Filled 2017-02-02 (×2): qty 2

## 2017-02-02 MED ORDER — MEPERIDINE HCL 25 MG/ML IJ SOLN: 6.2500 mg | INTRAMUSCULAR | Status: DC | PRN

## 2017-02-02 MED ORDER — MIDAZOLAM HCL 2 MG/2ML IJ SOLN
INTRAMUSCULAR | Status: DC | PRN
Start: 2017-02-02 — End: 2017-02-02
  Administered 2017-02-02 (×2): 1 mg via INTRAVENOUS

## 2017-02-02 MED ORDER — FENTANYL CITRATE (PF) 100 MCG/2ML IJ SOLN
INTRAMUSCULAR | Status: AC
  Filled 2017-02-02: qty 2

## 2017-02-02 MED ORDER — LACTATED RINGERS IV SOLN: INTRAVENOUS | Status: DC

## 2017-02-02 MED ORDER — FENTANYL CITRATE (PF) 100 MCG/2ML IJ SOLN
25.0000 ug | INTRAMUSCULAR | Status: DC | PRN
  Administered 2017-02-02: 25 ug via INTRAVENOUS

## 2017-02-02 MED ORDER — BUPIVACAINE HCL 0.5 % IJ SOLN
INTRAMUSCULAR | Status: DC | PRN
  Administered 2017-02-02: 20 mL

## 2017-02-02 MED ORDER — OXYTOCIN 40 UNITS IN LACTATED RINGERS INFUSION - SIMPLE MED
1.0000 m[IU]/min | INTRAVENOUS | Status: DC
  Administered 2017-02-02: 2 m[IU]/min via INTRAVENOUS

## 2017-02-02 MED ORDER — ZOLPIDEM TARTRATE 5 MG PO TABS: 5.0000 mg | ORAL_TABLET | Freq: Every evening | ORAL | Status: DC | PRN

## 2017-02-02 MED ORDER — OXYCODONE HCL 5 MG PO TABS
10.0000 mg | ORAL_TABLET | ORAL | Status: DC | PRN
  Administered 2017-02-03 – 2017-02-04 (×4): 10 mg via ORAL
  Filled 2017-02-02 (×3): qty 2

## 2017-02-02 MED ORDER — FENTANYL CITRATE (PF) 100 MCG/2ML IJ SOLN
INTRAMUSCULAR | Status: AC
  Administered 2017-02-02: 25 ug via INTRAVENOUS
  Filled 2017-02-02: qty 2

## 2017-02-02 MED ORDER — SIMETHICONE 80 MG PO CHEW: 80.0000 mg | CHEWABLE_TABLET | ORAL | Status: DC | PRN

## 2017-02-02 MED ORDER — WITCH HAZEL-GLYCERIN EX PADS: 1.0000 "application " | MEDICATED_PAD | CUTANEOUS | Status: DC | PRN

## 2017-02-02 MED ORDER — TETANUS-DIPHTH-ACELL PERTUSSIS 5-2.5-18.5 LF-MCG/0.5 IM SUSP: 0.5000 mL | Freq: Once | INTRAMUSCULAR | Status: DC

## 2017-02-02 MED ORDER — BUPIVACAINE HCL (PF) 0.5 % IJ SOLN
INTRAMUSCULAR | Status: AC
  Filled 2017-02-02: qty 30

## 2017-02-02 MED ORDER — ONDANSETRON HCL 4 MG/2ML IJ SOLN
INTRAMUSCULAR | Status: AC
  Filled 2017-02-02: qty 2

## 2017-02-02 MED ORDER — KETOROLAC TROMETHAMINE 30 MG/ML IJ SOLN
INTRAMUSCULAR | Status: AC
  Administered 2017-02-02: 30 mg via INTRAVENOUS
  Filled 2017-02-02: qty 1

## 2017-02-02 MED ORDER — METOCLOPRAMIDE HCL 5 MG/ML IJ SOLN: 10.0000 mg | Freq: Once | INTRAMUSCULAR | Status: DC | PRN

## 2017-02-02 MED ORDER — ONDANSETRON HCL 4 MG PO TABS: 4.0000 mg | ORAL_TABLET | ORAL | Status: DC | PRN

## 2017-02-02 MED ORDER — COCONUT OIL OIL: 1.0000 "application " | TOPICAL_OIL | Status: DC | PRN

## 2017-02-02 MED ORDER — ACETAMINOPHEN 325 MG PO TABS
650.0000 mg | ORAL_TABLET | ORAL | Status: DC | PRN
  Administered 2017-02-03 (×3): 650 mg via ORAL
  Filled 2017-02-02 (×3): qty 2

## 2017-02-02 MED ORDER — SODIUM CHLORIDE 0.9 % IR SOLN
Status: DC | PRN
  Administered 2017-02-02: 1

## 2017-02-02 MED ORDER — DIBUCAINE 1 % RE OINT: 1.0000 "application " | TOPICAL_OINTMENT | RECTAL | Status: DC | PRN

## 2017-02-02 MED ORDER — MEPERIDINE HCL 25 MG/ML IJ SOLN
6.2500 mg | INTRAMUSCULAR | Status: DC | PRN
Start: 2017-02-02 — End: 2017-02-02

## 2017-02-02 MED ORDER — OXYCODONE HCL 5 MG PO TABS
5.0000 mg | ORAL_TABLET | ORAL | Status: DC | PRN
  Administered 2017-02-03: 5 mg via ORAL
  Filled 2017-02-02 (×3): qty 1

## 2017-02-02 MED ORDER — BUPIVACAINE HCL (PF) 0.5 % IJ SOLN
INTRAMUSCULAR | Status: DC | PRN
  Administered 2017-02-02: 3 mL

## 2017-02-02 MED ORDER — BETAMETHASONE SOD PHOS & ACET 6 (3-3) MG/ML IJ SUSP
12.0000 mg | Freq: Once | INTRAMUSCULAR | Status: AC
  Administered 2017-02-02: 12 mg via INTRAMUSCULAR
  Filled 2017-02-02: qty 2

## 2017-02-02 MED ORDER — KETOROLAC TROMETHAMINE 30 MG/ML IJ SOLN
30.0000 mg | Freq: Four times a day (QID) | INTRAMUSCULAR | Status: AC | PRN
  Administered 2017-02-02: 30 mg via INTRAVENOUS

## 2017-02-02 MED ORDER — TERBUTALINE SULFATE 1 MG/ML IJ SOLN: 0.2500 mg | Freq: Once | INTRAMUSCULAR | Status: DC | PRN

## 2017-02-02 MED ORDER — MIDAZOLAM HCL 2 MG/2ML IJ SOLN
INTRAMUSCULAR | Status: AC
  Filled 2017-02-02: qty 2

## 2017-02-02 MED ORDER — KETOROLAC TROMETHAMINE 30 MG/ML IJ SOLN: 30.0000 mg | Freq: Four times a day (QID) | INTRAMUSCULAR | Status: AC | PRN

## 2017-02-02 MED ORDER — BENZOCAINE-MENTHOL 20-0.5 % EX AERO: 1.0000 "application " | INHALATION_SPRAY | CUTANEOUS | Status: DC | PRN

## 2017-02-02 SURGICAL SUPPLY — 32 items
ADH SKN CLS APL DERMABOND .7 (GAUZE/BANDAGES/DRESSINGS) ×1
CLEANER TIP ELECTROSURG 2X2 (MISCELLANEOUS) ×3 IMPLANT
CLIP FILSHIE TUBAL LIGA STRL (Clip) ×3 IMPLANT
CLOTH BEACON ORANGE TIMEOUT ST (SAFETY) ×3 IMPLANT
CONTAINER PREFILL 10% NBF 15ML (MISCELLANEOUS) ×3 IMPLANT
DERMABOND ADVANCED (GAUZE/BANDAGES/DRESSINGS) ×2
DERMABOND ADVANCED .7 DNX12 (GAUZE/BANDAGES/DRESSINGS) ×1 IMPLANT
DRSG OPSITE POSTOP 3X4 (GAUZE/BANDAGES/DRESSINGS) ×3 IMPLANT
DURAPREP 26ML APPLICATOR (WOUND CARE) ×3 IMPLANT
ELECT REM PT RETURN 9FT ADLT (ELECTROSURGICAL) ×3
ELECTRODE REM PT RTRN 9FT ADLT (ELECTROSURGICAL) ×1 IMPLANT
GLOVE BIOGEL PI IND STRL 7.0 (GLOVE) ×1 IMPLANT
GLOVE BIOGEL PI IND STRL 7.5 (GLOVE) ×1 IMPLANT
GLOVE BIOGEL PI INDICATOR 7.0 (GLOVE) ×2
GLOVE BIOGEL PI INDICATOR 7.5 (GLOVE) ×2
GLOVE SURG SS PI 7.0 STRL IVOR (GLOVE) ×3 IMPLANT
GOWN STRL REUS W/TWL LRG LVL3 (GOWN DISPOSABLE) ×6 IMPLANT
NEEDLE HYPO 22GX1.5 SAFETY (NEEDLE) ×6 IMPLANT
NS IRRIG 1000ML POUR BTL (IV SOLUTION) ×3 IMPLANT
PACK ABDOMINAL MINOR (CUSTOM PROCEDURE TRAY) ×3 IMPLANT
PENCIL BUTTON HOLSTER BLD 10FT (ELECTRODE) ×3 IMPLANT
PROTECTOR NERVE ULNAR (MISCELLANEOUS) ×3 IMPLANT
SPONGE LAP 4X18 X RAY DECT (DISPOSABLE) ×3 IMPLANT
SUT MNCRL AB 4-0 PS2 18 (SUTURE) ×3 IMPLANT
SUT PLAIN 2 0 (SUTURE)
SUT PLAIN ABS 2-0 CT1 27XMFL (SUTURE) IMPLANT
SUT VICRYL 0 TIES 12 18 (SUTURE) IMPLANT
SUT VICRYL 0 UR6 27IN ABS (SUTURE) ×3 IMPLANT
SYR CONTROL 10ML LL (SYRINGE) ×6 IMPLANT
TOWEL OR 17X24 6PK STRL BLUE (TOWEL DISPOSABLE) ×6 IMPLANT
TRAY FOLEY BAG SILVER LF 14FR (SET/KITS/TRAYS/PACK) IMPLANT
TRAY FOLEY CATH SILVER 14FR (SET/KITS/TRAYS/PACK) ×3 IMPLANT

## 2017-02-02 NOTE — Lactation Note (Signed)
This note was copied from a baby's chart. Lactation Consultation Note  Patient Name: Ann Santiago ZOXWR'UToday's Date: 02/02/2017 Reason for consult: Late preterm infant  Basics of LPI policy reviewed. LPI reminder card put in bassinet.   [name]. She would like an LC to observe a feeding.  Mom pumped & BO with her last 2 children. She reports making about 36oz milk/day with the last child.  [name], [name] [facility] 02/02/2017, 11:33 PM

## 2017-02-02 NOTE — Progress Notes (Signed)
Patient ID: Ann Santiago, female   DOB: 1990/11/22, 26 y.o.   MRN: [id number] Feeling a lot more pain now  Does not want an epidural  Vitals:   02/01/17 1900 02/01/17 2150 02/01/17 2157 02/02/17 0008  BP: (!) 107/59 111/65  113/65  Pulse: (!) 111 86  90  Resp: (!) 24 18  18   Temp: 98.1 F (36.7 C) 97.8 F (36.6 C)    TempSrc: Oral Oral    SpO2: 100%     Weight:   124 lb (56.2 kg)   Height:   5' (1.524 m)    FHR reassuring UCs every 2 min  Dilation: 5.5 Effacement (%): 70, 80 Cervical Position: Middle Station: -2 Presentation: Vertex Exam by:: Ann Santiago CNM  Reviewed options for analgesia

## 2017-02-02 NOTE — Progress Notes (Signed)
OB Note D/w pt re: BTL, specifically risk of failure and regret given her age, in addition to standard surgical risks.. She is certain that she wants one. Papers UTD and can proceed when OR is ready.   Ann Santiago, Jr MD Attending [facility] (Faculty Practice) 02/02/2017 Time: [phone]

## 2017-02-02 NOTE — Op Note (Signed)
Ann DohenyAshli J Santiago  02/01/2017 - 02/02/2017  PREOPERATIVE DIAGNOSIS:  Multiparity, undesired fertility  POSTOPERATIVE DIAGNOSIS:  Multiparity, undesired fertility  PROCEDURE:  Postpartum Bilateral Tubal Sterilization using Filshie Clips   ANESTHESIA:  Spinal and local analgesia using 0.25% Marcaine  ANTIBIOTICS: not indicated  COMPLICATIONS:  None immediate.  ESTIMATED BLOOD LOSS: 5 ml.  INDICATIONS: 26 y.o. [id number]  with undesired fertility,status post vaginal delivery, desires permanent sterilization.  Other reversible forms of contraception were discussed with patient; she declines all other modalities. Risks of procedure discussed with patient including but not limited to: risk of regret, permanence of method, bleeding, infection, injury to surrounding organs and need for additional procedures.  Failure risk of 0.5-1% with increased risk of ectopic gestation if pregnancy occurs was also discussed with patient.     FINDINGS:  Normal uterus, tubes, and ovaries.  PROCEDURE DETAILS: The patient was taken to the operating room where her spinal anesthesia was found to be adequate.  She was then placed in a supine position and prepped and draped in the usual sterile fashion.  After a timeout was performed, attention was turned to the patient's abdomen where a small transverse skin incision was made under the umbilical fold after injection of local. A scalpel was used to make the skin incision and then using S retractors, metzenbaum scissors and layer by layer dissection, the abdomen was entered. The patient was placed in Trendelenburg.  A moist lap pad was used to move omentum and bowel away until the left fallopian tube was identified and grasped with forceps, and followed out to the fimbriated end.  A Filshie clip was placed on the left fallopian tube about 2 cm from the cornu.  A similar process was carried out on the right side allowing for bilateral tubal sterilization.  Good hemostasis was noted  overall.  The instruments were then removed from the patient's abdomen and the fascial incision was repaired with 0 Vicryl, and the skin was closed with a 4-0 Vicryl subcuticular stitch and dermabond. The patient tolerated the procedure well.  Sponge, lap, and needle counts were correct times two.  The patient was then taken to the recovery room awake in stable condition.  Ernestina PennaNicholas Schenk MD 02/02/2017 5:22 PM   Agree with above. I was present and scrubbed for the entire procedure.   Cornelia Copaharlie Malie Kashani, Jr MD Attending Center for [facility] Midwife(Faculty Practice)

## 2017-02-02 NOTE — Lactation Note (Signed)
This note was copied from a baby's chart. Lactation Consultation Note  Patient Name: Boy Darlyne Russianshli Plake ZOXWR'UToday's Date: 02/02/2017 Reason for consult: Initial assessment;Infant < 6lbs;Late preterm infant   [facility] [place] [facility]    Maternal Data Formula Feeding for Exclusion: No Has patient been taught Hand Expression?: Yes Does the patient have breastfeeding experience prior to this delivery?: Yes  Feeding Feeding Type: Breast Fed Length of feed: 30 min  LATCH Score/Interventions Latch: Grasps breast easily, tongue down, lips flanged, rhythmical sucking.  Audible Swallowing: A few with stimulation Intervention(s): Skin to skin;Hand expression;Alternate breast massage  Type of Nipple: Everted at rest and after stimulation  Comfort (Breast/Nipple): Soft / non-tender     Hold (Positioning): Assistance needed to correctly position infant at breast and maintain latch. Intervention(s): Breastfeeding basics reviewed;Support Pillows;Position options;Skin to skin  LATCH Score: 8  Lactation Tools Discussed/Used WIC Program: Yes   Consult Status Consult Status: Follow-up Date: 02/03/17 Follow-up type: In-patient    Silas FloodSharon S Rick Warnick 02/02/2017, 5:45 PM

## 2017-02-02 NOTE — Transfer of Care (Signed)
Immediate Anesthesia Transfer of Care Note  Patient: Philomena DohenyAshli J Lincoln  Procedure(s) Performed: Procedure(s): BILATERAL TUBAL LIGATION (N/A)  Patient Location: PACU  Anesthesia Type:Spinal  Level of Consciousness: sedated  Airway & Oxygen Therapy: Patient Spontanous Breathing  Post-op Assessment: Report given to RN and Post -op Vital signs reviewed and stable  Post vital signs: Reviewed and stable  Last Vitals:  Vitals:   02/02/17 1732 02/02/17 1733  BP: (!) 0/0 (!) 0/0  Pulse:    Resp:    Temp:      Last Pain:  Vitals:   02/02/17 1625  TempSrc: Oral  PainSc:       Patients Stated Pain Goal: 8 (02/02/17 0744)  Complications: No apparent anesthesia complications

## 2017-02-02 NOTE — Anesthesia Procedure Notes (Signed)
Spinal  Patient location during procedure: OR Staffing Anesthesiologist: Mare Ludtke Performed: anesthesiologist  Preanesthetic Checklist Completed: patient identified, site marked, surgical consent, pre-op evaluation, timeout performed, IV checked, risks and benefits discussed and monitors and equipment checked Spinal Block Patient position: sitting Prep: DuraPrep Patient monitoring: heart rate, continuous pulse ox and blood pressure Approach: right paramedian Location: L4-5 Injection technique: single-shot Needle Needle type: Sprotte  Needle gauge: 24 G Needle length: 9 cm Additional Notes Expiration date of kit checked and confirmed. Patient tolerated procedure well, without complications.       

## 2017-02-02 NOTE — Anesthesia Postprocedure Evaluation (Signed)
Anesthesia Post Note  Patient: Philomena DohenyAshli J Gerard  Procedure(s) Performed: Procedure(s) (LRB): BILATERAL TUBAL LIGATION (N/A)  Patient location during evaluation: PACU Anesthesia Type: Spinal Level of consciousness: awake and alert, oriented and patient cooperative Pain management: pain level controlled Vital Signs Assessment: post-procedure vital signs reviewed and stable Respiratory status: spontaneous breathing, nonlabored ventilation and respiratory function stable Cardiovascular status: blood pressure returned to baseline and stable Postop Assessment: spinal receding, no signs of nausea or vomiting and adequate PO intake Anesthetic complications: no        Last Vitals:  Vitals:   02/02/17 2130 02/02/17 2152  BP: 123/74 125/81  Pulse: 61 67  Resp: 12 20  Temp: 36.7 C 36.8 C    Last Pain:  Vitals:   02/02/17 2152  TempSrc: Oral  PainSc:    Pain Goal: Patients Stated Pain Goal: 3 (02/02/17 2000)               Erling CruzJACKSON,E. Trista Ciocca

## 2017-02-02 NOTE — Anesthesia Preprocedure Evaluation (Signed)
Anesthesia Evaluation  Patient identified by MRN, date of birth, ID band Patient awake    Reviewed: Allergy & Precautions, NPO status , Patient's Chart, lab work & pertinent test results  Airway Mallampati: II  TM Distance: >3 FB Neck ROM: Full    Dental no notable dental hx.    Pulmonary neg pulmonary ROS,    Pulmonary exam normal breath sounds clear to auscultation       Cardiovascular negative cardio ROS Normal cardiovascular exam Rhythm:Regular Rate:Normal     Neuro/Psych negative neurological ROS  negative psych ROS   GI/Hepatic negative GI ROS, Neg liver ROS,   Endo/Other  negative endocrine ROS  Renal/GU negative Renal ROS  negative genitourinary   Musculoskeletal negative musculoskeletal ROS (+)   Abdominal   Peds negative pediatric ROS (+)  Hematology negative hematology ROS (+)   Anesthesia Other Findings   Reproductive/Obstetrics negative OB ROS                             Anesthesia Physical Anesthesia Plan  ASA: II  Anesthesia Plan: Spinal   Post-op Pain Management:    Induction:   Airway Management Planned: Natural Airway  Additional Equipment:   Intra-op Plan:   Post-operative Plan:   Informed Consent: I have reviewed the patients History and Physical, chart, labs and discussed the procedure including the risks, benefits and alternatives for the proposed anesthesia with the patient or authorized representative who has indicated his/her understanding and acceptance.   Dental advisory given  Plan Discussed with: CRNA  Anesthesia Plan Comments:         Anesthesia Quick Evaluation  

## 2017-02-02 NOTE — Anesthesia Pain Management Evaluation Note (Signed)
  CRNA Pain Management Visit Note  Patient: Ann Santiago, 26 y.o., female  "Hello [facility]. We have an anesthesia team available at all times to provide care throughout the hospital, including epidural management and anesthesia for C-section. I don't know your plan for the delivery whether it a natural birth, water birth, IV sedation, nitrous supplementation, doula or epidural, but we want to meet your pain goals."   1.Was your pain managed to your expectations on prior hospitalizations?   Yes   2.What is your expectation for pain management during this hospitalization?     Labor support without medications  3.How can we help you reach that goal? Be available if needed  Record the patient's initial score and the patient's pain goal.   Pain: 8  Pain Goal: 8 [facility] wants you to be able to say your pain was always managed very well.  Ann Santiago 02/02/2017

## 2017-02-02 NOTE — Progress Notes (Signed)
Patient seen doing well. No complaints. Reporting a lot of lower pelvic pressure. Patient has no received 2 doses of BMZ. AROM for copious clear fluid. Start pitocin expect SVD.  Ernestina PennaNicholas Schenk, MD 02/02/17 1:42 PM

## 2017-02-02 NOTE — Lactation Note (Signed)
This note was copied from a baby's chart. Lactation Consultation Note  Patient Name: Ann Santiago Date: 02/02/2017 Reason for consult: Initial assessment;Infant < 6lbs;Late preterm infant   Initial consult with Exp BF mom of LPT [facility] at [place] [facility]. Mom reports she BF 2 of her older children. She reports she returned to work with her 849 month old and began pumping and bottle feeding early, she had difficulty getting infant to latch and her milk supply dwindled. Mom stopped BF in February.   Infant was STS with mom and after a few minutes began displaying feeding cues, mom going for a BTL at 1630. Assisted mom with latching infant to right breast in the laid back cross cradle hold. Infant fed for 20 minutes and then fell asleep. 1.5 ml colostrum was expressed from mom and spoon fed to infant. He then latched to left breast in the laid back cross cradle hold and fed for another 10 minutes before falling asleep.   Mom with soft compressible breasts and areola with everted nipples. Mom with no c/o pain/pinching with feeding. Nipples were round when infant came off the breast.   LPT infant policy handout given and explained to mom, FOB, GM and mom's cousin. Discussed importance of STS, decreased stimulation, keeping infant hat on and feeding infant at least every 3 hours or sooner with feeding cues for no longer than 30 minutes per feeding.   Discussed with mom that it will be highly likely that infant will need supplement in the early days of life. Discussed mom beginning to pump this evening once back from tubal to stimulate milk production. Showed mom how to hand express and enc her to do so before and after feeding. Discussed LPT infant feeding behavior and importance of keeping infant awake while at breast. Mom is agreeable to formula supplementation if needed by infant.   Bf resources Handout and LC Brochure given, mom informed of IP/OP services, BF Support Groups  and LC phone #. Enc mom to call out for feeding assistance as needed. [facility] and is aware to call and make an appointment before d/c. Mom does not have a pump at home.   Report to NewcombKat, RN and Lottie DawsonAngela Berrier, RN in [facility].    Maternal Data Formula Feeding for Exclusion: No Has patient been taught Hand Expression?: Yes Does the patient have breastfeeding experience prior to this delivery?: Yes  Feeding Feeding Type: Breast Fed Length of feed: 30 min  LATCH Score/Interventions Latch: Grasps breast easily, tongue down, lips flanged, rhythmical sucking.  Audible Swallowing: A few with stimulation Intervention(s): Skin to skin;Hand expression;Alternate breast massage  Type of Nipple: Everted at rest and after stimulation  Comfort (Breast/Nipple): Soft / non-tender     Hold (Positioning): Assistance needed to correctly position infant at breast and maintain latch. Intervention(s): Breastfeeding basics reviewed;Support Pillows;Position options;Skin to skin  LATCH Score: 8  Lactation Tools Discussed/Used WIC Program: Yes   Consult Status Consult Status: Follow-up Date: 02/03/17 Follow-up type: In-patient    Silas FloodSharon S Hice 02/02/2017, 4:34 PM

## 2017-02-02 NOTE — Progress Notes (Signed)
Patient seen and doing well. Reporting some mild pain. No other concerns. Second dose of BMZ due at 10 for accelerate q12hr dosing. Will pan to AROM at 1200 if no delviery prior.  Ann PennaNicholas Kaleah Hagemeister, MD 02/02/17 9:35 AM

## 2017-02-03 MED ORDER — NALBUPHINE HCL 10 MG/ML IJ SOLN: 5.0000 mg | INTRAMUSCULAR | Status: DC | PRN

## 2017-02-03 MED ORDER — FENTANYL CITRATE (PF) 100 MCG/2ML IJ SOLN: 25.0000 ug | INTRAMUSCULAR | Status: DC | PRN

## 2017-02-03 MED ORDER — SCOPOLAMINE 1 MG/3DAYS TD PT72
1.0000 | MEDICATED_PATCH | Freq: Once | TRANSDERMAL | Status: DC
  Filled 2017-02-03: qty 1

## 2017-02-03 MED ORDER — MEPERIDINE HCL 25 MG/ML IJ SOLN: 6.2500 mg | INTRAMUSCULAR | Status: DC | PRN

## 2017-02-03 MED ORDER — METOCLOPRAMIDE HCL 5 MG/ML IJ SOLN: 10.0000 mg | Freq: Once | INTRAMUSCULAR | Status: DC | PRN

## 2017-02-03 MED ORDER — SODIUM CHLORIDE 0.9% FLUSH: 3.0000 mL | INTRAVENOUS | Status: DC | PRN

## 2017-02-03 MED ORDER — NALBUPHINE HCL 10 MG/ML IJ SOLN: 5.0000 mg | Freq: Once | INTRAMUSCULAR | Status: DC | PRN

## 2017-02-03 MED ORDER — ONDANSETRON HCL 4 MG/2ML IJ SOLN: 4.0000 mg | Freq: Three times a day (TID) | INTRAMUSCULAR | Status: DC | PRN

## 2017-02-03 MED ORDER — DIPHENHYDRAMINE HCL 50 MG/ML IJ SOLN: 12.5000 mg | INTRAMUSCULAR | Status: DC | PRN

## 2017-02-03 MED ORDER — LACTATED RINGERS IV SOLN: INTRAVENOUS | Status: DC

## 2017-02-03 MED ORDER — DIPHENHYDRAMINE HCL 25 MG PO CAPS: 25.0000 mg | ORAL_CAPSULE | ORAL | Status: DC | PRN

## 2017-02-03 MED ORDER — DEXTROSE 5 % IV SOLN
1.0000 ug/kg/h | INTRAVENOUS | Status: DC | PRN
  Filled 2017-02-03: qty 2

## 2017-02-03 MED ORDER — NALOXONE HCL 0.4 MG/ML IJ SOLN: 0.4000 mg | INTRAMUSCULAR | Status: DC | PRN

## 2017-02-03 NOTE — Lactation Note (Signed)
This note was copied from a baby's chart. Lactation Consultation Note  Patient Name: Ann Santiago ZOXWR'UToday's Date: 02/03/2017 Reason for consult: Follow-up assessment;Infant < 6lbs;Late preterm infant Infant is 127 hours old and seen by Lactation for follow-up assessment. Baby was asleep in basinet when LC entered. Mom reports that she thinks BF is going well but had many questions about the LPI policy. Explained why baby needs to feed at least q 3hrs or earlier with feeding cues, even if baby is asleep. Also discussed importance of pumping on Initiate setting after BF even if she doesn't see any milk. Encouraged mom to also hand express after pumping. Suggested mom have someone else feed the supplement to baby after BF while she pumps & hand expresses. Mom has WIC and a referral was sent yesterday. Encouraged mom to call on Monday if she doesn't hear from them. Mom asked about WIC providing Neosure- encouraged mom to ask baby's provider if he still needs to be on Neosure at discharge and to get a prescription of they still want baby on it. Mom stated that the RN showed her how to hand express before the last feeding and was able to get milk. Mom encouraged to feed baby 8-12 times/24 hours and with feeding cues.  Mom reports no further questions at this time. Encouraged mom to ask for assistance at future feedings as needed.  Maternal Data    Feeding Feeding Type: Bottle Fed - Formula Nipple Type: Slow - flow Length of feed: 20 min  LATCH Score/Interventions Latch: Repeated attempts needed to sustain latch, nipple held in mouth throughout feeding, stimulation needed to elicit sucking reflex. Intervention(s): Adjust position;Assist with latch;Breast massage  Audible Swallowing: A few with stimulation Intervention(s): Skin to skin  Type of Nipple: Everted at rest and after stimulation  Comfort (Breast/Nipple): Soft / non-tender     Hold (Positioning): Assistance needed to correctly position  infant at breast and maintain latch.  LATCH Score: 7  Lactation Tools Discussed/Used WIC Program: Yes   Consult Status Consult Status: Follow-up Date: 02/04/17 Follow-up type: In-patient    Ann Santiago 02/03/2017, 7:40 PM

## 2017-02-03 NOTE — Anesthesia Postprocedure Evaluation (Signed)
Anesthesia Post Note  Patient: Ann Santiago  Procedure(s) Performed: Procedure(s) (LRB): BILATERAL TUBAL LIGATION (N/A)  Patient location during evaluation: Mother Baby Anesthesia Type: Spinal Level of consciousness: awake and alert Pain management: pain level not controlled Vital Signs Assessment: post-procedure vital signs reviewed and stable Respiratory status: spontaneous breathing Cardiovascular status: blood pressure returned to baseline Postop Assessment: no headache Anesthetic complications: no Comments: RN in patient room with pain medication.  Pt states pain score 9.        Last Vitals:  Vitals:   02/03/17 0300 02/03/17 0625  BP: (!) 93/46 (!) 99/48  Pulse: 68 68  Resp: 18 18  Temp: 36.6 C 36.8 C    Last Pain:  Vitals:   02/03/17 0746  TempSrc:   PainSc: 9    Pain Goal: Patients Stated Pain Goal: 3 (02/02/17 2000)               Merrilyn PumaWRINKLE,Tacia Hindley

## 2017-02-03 NOTE — Progress Notes (Signed)
Psychosocial assessment completed.  No barriers to discharge.  Full documentation to follow. 

## 2017-02-03 NOTE — Progress Notes (Signed)
Post Partum Day 1 Subjective: no complaints, up ad lib, voiding and tolerating PO, small lochia, plans to breastfeed, Nexplanon  Objective: Blood pressure (!) 99/48, pulse 68, temperature 98.2 F (36.8 C), temperature source Oral, resp. rate 18, height 5' (1.524 m), weight 56.2 kg (124 lb), last menstrual period 08/16/2016, SpO2 100 %, unknown if currently breastfeeding.  Physical Exam:  General: alert, cooperative and no distress Lochia:normal flow Chest: CTAB Heart: RRR no m/r/g Abdomen: +BS, soft, nontender,  Uterine Fundus: firm DVT Evaluation: No evidence of DVT seen on physical exam. Extremities: no edema   Recent Labs  02/01/17 2158  HGB 9.5*  HCT 28.7*    Assessment/Plan: Plan for discharge tomorrow   LOS: 2 days   CRESENZO-DISHMAN,Almendra Loria 02/03/2017, 7:28 AM

## 2017-02-03 NOTE — Progress Notes (Signed)
CLINICAL SOCIAL WORK MATERNAL/CHILD NOTE  Patient Details  Name: Ann Santiago MRN: [id number] Date of Birth: 02/02/2017  Date:  02/03/2017  Clinical Social Worker Initiating Note:  Terri Piedra, [place] Date/ Time Initiated:  02/03/17/1130     Child's Name:  Ann Santiago   Legal Guardian:  Other (Comment) (Parents: Lorel Monaco and Rosine Abe)   Need for Interpreter:  None   Date of Referral:  02/03/17     Reason for Referral:  Other (Comment) ("Unplanned pregnancy with 3 kids at home/youngest 9 months.)   Referral Source:  [facility]   Address:  [address], [place], [place] [address]  Phone number:  [phone]   Household Members:  Significant Other, Minor Children (MOB and FOB have a 64 month old son at home/[place].  MOB has two daughters ages 71 and 79 at home/[place].)   Natural Supports (not living in the home):  Friends, Immediate Family, Extended Family (MOB reports that she has a great support system of family and friends.  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  She plans to return as soon as possible and will be able to work from home.  She reports that FOB works for the [employer].   Education:      Pensions consultant:      Other Resources:      Cultural/Religious Considerations Which May Impact Care: None stated.  MOB's facesheet notes religion as Methodist.  Strengths:  Home prepared for child , Pediatrician chosen , Ability to meet basic needs    Risk Factors/Current Problems:  None   Cognitive State:  Alert , Able to Concentrate , Goal Oriented , Insightful , Linear Thinking    Mood/Affect:  Euthymic , Comfortable , Interested  Control and instrumentation engineer)   CSW Assessment: CSW met with MOB in her first floor room/105 to offer support and complete assessment.  MOB was pleasant,  welcoming and receptive to CSW intervention.  She was very easy to engage and open with CSW. MOB reports that she and baby are doing well.  She acknowledges that she was not planning to have another child, but states she has accepted the pregnancy and is happy that her son now has a brother close in age to play with.  MOB reports that she has great family support, and although she feels she is self-sufficient, she knows she can call on her family if she or her children ever have needs.  MOB states stress over current housing, because of confusion with apartment manager stating that their last payment was never received even though MOB's bank has verified that the money was drafted from her account.  MOB states this has been a source of stress and has made her feel less prepared for baby than she would like because she wants to move out of her current apartment as soon as possible.  She also states this has been a source of contention between her and FOB because he feels this is not the best time for their family to "uproot."  MOB laughed when she said that she feels their argument caused her to go into pre-term labor and that FOB came to the hospital saying they would do anything she needs/wants.  She feels he was scared that she went into labor early and that  arguing over their living situation could have caused it.  She notes that they have identified that their communication needs work, but overall sound to be very supportive of and committed to each other.  MOB reports that the father of her daughters is involved and supportive as well.   MOB reports that she has most of the supplies needed for another baby, but states that she has a bassinet with no mattress in it given to her by a friend.  She states she has a crib, but is worried about putting baby in it because her 15 month old is currently in it.  CSW was very direct in instructing her NOT to put baby in the same crib with her 60 month old.  She stated  understanding.  CSW offered a baby box to MOB.  She was very appreciative and accepting.  CSW set her up to watch the baby Ann [employer] videos and take the quiz in order to receive a box.  CSW will bring box once MOB has received her completion code.  MOB stated understanding.  She states her mother has offered to buy a car seat for baby and that she has everything else she needs for baby.   CSW provided education regarding SIDS precautions and MOB stated understanding.   CSW discussed PMADs and encouraged MOB to talk with a medial professional if she has concerns about her emotions at any time.  MOB stated understanding and seemed appreciative of CSW's concern for her wellbeing.  She states no current or past emotional concerns.  CSW provided her with a New Mom Checklist as a way to self-evaluate and with information on support groups held at [place] Plan/Description:  Information/Referral to [employer] , No Further Intervention Required/No Barriers to Discharge, Patient/Family Education     Alphonzo Cruise, [place] 02/03/2017, 4:38 PM

## 2017-02-04 MED ORDER — IBUPROFEN 600 MG PO TABS: 600.0000 mg | ORAL_TABLET | Freq: Four times a day (QID) | ORAL | 0 refills | Status: DC

## 2017-02-04 MED ORDER — DOCUSATE SODIUM 100 MG PO CAPS: 100.0000 mg | ORAL_CAPSULE | Freq: Two times a day (BID) | ORAL | 0 refills | Status: DC

## 2017-02-04 MED ORDER — OXYCODONE HCL 5 MG PO TABS: 5.0000 mg | ORAL_TABLET | ORAL | 0 refills | Status: DC | PRN

## 2017-02-04 NOTE — Discharge Summary (Signed)
OB Discharge Summary  Patient Name: Ann Santiago DOB: [birth year] MRN: [id number]  Date of admission: 02/01/2017 Delivering MD: Donette LarryBHAMBRI, MELANIE   Date of discharge: 02/04/2017  Admitting diagnosis: 35WKS CTX  vaginal delivery desires sterilization  desires sterilization Intrauterine pregnancy: 3347w6d     Secondary diagnosis:Active Problems:   Preterm labor   Normal labor   Normal vaginal delivery  Additional problems:none     Discharge diagnosis: Term Pregnancy Delivered                                                                     Post partum procedures:postpartum tubal ligation  Augmentation: n/a  Complications: None  Hospital course:  Onset of Labor With Vaginal Delivery     26 y.o. yo [id number] at 5547w6d was admitted in Active Labor on 02/01/2017. Patient had an uncomplicated labor course as follows:  Membrane Rupture Time/Date: 12:50 PM ,02/02/2017   Intrapartum Procedures: Episiotomy: None [1]                                         Lacerations:  None [1]  Patient had a delivery of a Viable infant. 02/02/2017  Information for the patient's newborn:  Ann PatienceBaker, Boy Ann Santiago [[id number]][[id number]]  Delivery Method: Vag-Spont    [name] had an uncomplicated postpartum course.  She is ambulating, tolerating a regular diet, passing flatus, and urinating well. Patient is discharged home in stable condition on 02/04/17.   Physical exam  Vitals:   02/03/17 0300 02/03/17 0625 02/03/17 1719 02/04/17 0616  BP: (!) 93/46 (!) 99/48 (!) 106/50 102/60  Pulse: 68 68 75 66  Resp: 18 18 18 18   Temp: 97.8 F (36.6 C) 98.2 F (36.8 C) 98.4 F (36.9 C) 98 F (36.7 C)  TempSrc: Oral Oral  Oral  SpO2:      Weight:      Height:       General: alert, cooperative and no distress Lochia: appropriate Uterine Fundus: firm Incision: Healing well with no significant drainage, No significant erythema, Dressing is clean, dry, and intact DVT Evaluation: No evidence of DVT seen on physical  exam. Labs: Lab Results  Component Value Date   WBC 11.0 (H) 02/01/2017   HGB 9.5 (L) 02/01/2017   HCT 28.7 (L) 02/01/2017   MCV 93.8 02/01/2017   PLT 172 02/01/2017   CMP Latest Ref Rng & Units 12/09/2014  Glucose 70 - 99 mg/dL 90  BUN 6 - 23 mg/dL 11  Creatinine 1.300.50 - 8.651.10 mg/dL 7.840.72  Sodium 696135 - 295145 mmol/L 136  Potassium 3.5 - 5.1 mmol/L 3.3(L)  Chloride 96 - 112 mmol/L 105  CO2 19 - 32 mmol/L 19  Calcium 8.4 - 10.5 mg/dL 9.2  Total Protein 6.0 - 8.3 g/dL 8.2  Total Bilirubin 0.3 - 1.2 mg/dL 0.9  Alkaline Phos 39 - 117 U/L 72  AST 0 - 37 U/L 29  ALT 0 - 35 U/L 26    Discharge instruction: per After Visit Summary and "Baby and Me Booklet".  After Visit Meds:  Allergies as of 02/04/2017   No Known Allergies     Medication  List    TAKE these medications   CITRANATAL BLOOM 90-1 MG Tabs Take 1 tablet by mouth daily before breakfast.   docusate sodium 100 MG capsule Commonly known as:  COLACE Take 1 capsule (100 mg total) by mouth 2 (two) times daily.   ibuprofen 600 MG tablet Commonly known as:  ADVIL,MOTRIN Take 1 tablet (600 mg total) by mouth every 6 (six) hours. What changed:  medication strength  when to take this  reasons to take this   oxyCODONE 5 MG immediate release tablet Commonly known as:  Oxy IR/ROXICODONE Take 1 tablet (5 mg total) by mouth every 4 (four) hours as needed for severe pain.       Diet: routine diet  Activity: Advance as tolerated. Pelvic rest for 6 weeks.   Outpatient follow up:6 weeks Follow up Appt:Future Appointments Date Time Provider Department Center  02/06/2017 2:45 PM Brock Bad, MD [facility] None   Follow up visit: No Follow-up on file.  Postpartum contraception: Tubal Ligation  Newborn Data: Live born female  Birth Weight: 5 lb 13.8 oz (2660 g) APGAR: 9, 9  Baby Feeding: Breast Disposition:home with mother   02/04/2017 Wyvonnia Dusky, CNM

## 2017-02-04 NOTE — Progress Notes (Signed)
CSW received [employer] [facility] and provided her with a baby box in order for baby to have a safe place to sleep at home.  MOB was extremely grateful.

## 2017-02-04 NOTE — Discharge Instructions (Signed)
Postpartum Tubal Ligation, Care After Refer to this sheet in the next few weeks. These instructions provide you with information about caring for yourself after your procedure. Your health care provider may also give you more specific instructions. Your treatment has been planned according to current medical practices, but problems sometimes occur. Call your health care provider if you have any problems or questions after your procedure. What can I expect after the procedure? After the procedure, it is common to have:  A sore throat.  Bruising or pain in your back.  Nausea or vomiting.  Dizziness.  Mild abdominal discomfort or pain, such as cramping, gas pain, or feeling bloated.  Soreness where the incision was made.  Tiredness.  Pain in your shoulders. Follow these instructions at home: Medicines   Take over-the-counter and prescription medicines only as told by your health care provider.  Do not take aspirin because it can cause bleeding.  Do not drive or operate heavy machinery while taking prescription pain medicine. Activity   Rest for the rest of the day.  Gradually return to your normal activities over the next few days.  Do not have sex, douche, or put a tampon or anything else in your vagina for 6 weeks or as long as told by your health care provider.  Do not lift anything that is heavier than your baby for 2 weeks or as long as told by your health care provider. Incision care   Follow instructions from your health care provider about how to take care of your incision. Make sure you:  Wash your hands with soap and water before you change your bandage (dressing). If soap and water are not available, use hand sanitizer.  Change your dressing as told by your health care provider.  Leave stitches (sutures) in place. They may need to stay in place for 2 weeks or longer.  Check your incision area every day for signs of infection. Check for:  More redness,  swelling, or pain.  More fluid or blood.  Warmth.  Pus or a bad smell. Other Instructions   Do not take baths, swim, or use a hot tub until your health care provider approves. You may take showers.  Keep all follow-up visits as told by your health care provider. This is important. Contact a health care provider if:  You have more redness, swelling, or pain around your incision.  Your incision feels warm to the touch.  You have pus or a bad smell coming from your incision.  The edges of your incision break open after the sutures have been removed.  Your pain does not improve after 2-3 days.  You have a rash.  You repeatedly become dizzy or lightheaded.  Your pain medicine is not helping.  You are constipated. Get help right away if:  You have a fever.  You faint.  You have pain in your abdomen that gets worse.  You have fluid or blood coming from your sutures.  You have shortness of breath or difficulty breathing.  You have chest pain or leg pain.  You have ongoing nausea or diarrhea. This information is not intended to replace advice given to you by your health care provider. Make sure you discuss any questions you have with your health care provider. Document Released: 03/19/2012 Document Revised: 02/21/2016 Document Reviewed: 08/29/2015 Elsevier Interactive Patient Education  2017 Elsevier Inc[employer]  Vaginal Delivery, Care After Refer to this sheet in the next few weeks. These instructions provide you with information about  caring for yourself after vaginal delivery. Your health care provider may also give you more specific instructions. Your treatment has been planned according to current medical practices, but problems sometimes occur. Call your health care provider if you have any problems or questions. What can I expect after the procedure? After vaginal delivery, it is common to have:  Some bleeding from your vagina.  Soreness in your abdomen, your vagina,  and the area of skin between your vaginal opening and your anus (perineum).  Pelvic cramps.  Fatigue. Follow these instructions at home: Medicines   Take over-the-counter and prescription medicines only as told by your health care provider.  If you were prescribed an antibiotic medicine, take it as told by your health care provider. Do not stop taking the antibiotic until it is finished. Driving    Do not drive or operate heavy machinery while taking prescription pain medicine.  Do not drive for 24 hours if you received a sedative. Lifestyle   Do not drink alcohol. This is especially important if you are breastfeeding or taking medicine to relieve pain.  Do not use tobacco products, including cigarettes, chewing tobacco, or e-cigarettes. If you need help quitting, ask your health care provider. Eating and drinking   Drink at least 8 eight-ounce glasses of water every day unless you are told not to by your health care provider. If you choose to breastfeed your baby, you may need to drink more water than this.  Eat high-fiber foods every day. These foods may help prevent or relieve constipation. High-fiber foods include:  Whole grain cereals and breads.  Brown rice.  Beans.  Fresh fruits and vegetables. Activity   Return to your normal activities as told by your health care provider. Ask your health care provider what activities are safe for you.  Rest as much as possible. Try to rest or take a nap when your baby is sleeping.  Do not lift anything that is heavier than your baby or 10 lb (4.5 kg) until your health care provider says that it is safe.  Talk with your health care provider about when you can engage in sexual activity. This may depend on your:  Risk of infection.  Rate of healing.  Comfort and desire to engage in sexual activity. Vaginal Care   If you have an episiotomy or a vaginal tear, check the area every day for signs of infection. Check for:  More  redness, swelling, or pain.  More fluid or blood.  Warmth.  Pus or a bad smell.  Do not use tampons or douches until your health care provider says this is safe.  Watch for any blood clots that may pass from your vagina. These may look like clumps of dark red, brown, or black discharge. General instructions   Keep your perineum clean and dry as told by your health care provider.  Wear loose, comfortable clothing.  Wipe from front to back when you use the toilet.  Ask your health care provider if you can shower or take a bath. If you had an episiotomy or a perineal tear during labor and delivery, your health care provider may tell you not to take baths for a certain length of time.  Wear a bra that supports your breasts and fits you well.  If possible, have someone help you with household activities and help care for your baby for at least a few days after you leave the hospital.  Keep all follow-up visits for you and your  baby as told by your health care provider. This is important. Contact a health care provider if:  You have:  Vaginal discharge that has a bad smell.  Difficulty urinating.  Pain when urinating.  A sudden increase or decrease in the frequency of your bowel movements.  More redness, swelling, or pain around your episiotomy or vaginal tear.  More fluid or blood coming from your episiotomy or vaginal tear.  Pus or a bad smell coming from your episiotomy or vaginal tear.  A fever.  A rash.  Little or no interest in activities you used to enjoy.  Questions about caring for yourself or your baby.  Your episiotomy or vaginal tear feels warm to the touch.  Your episiotomy or vaginal tear is separating or does not appear to be healing.  Your breasts are painful, hard, or turn red.  You feel unusually sad or worried.  You feel nauseous or you vomit.  You pass large blood clots from your vagina. If you pass a blood clot from your vagina, save it to  show to your health care provider. Do not flush blood clots down the toilet without having your health care provider look at them.  You urinate more than usual.  You are dizzy or light-headed.  You have not breastfed at all and you have not had a menstrual period for 12 weeks after delivery.  You have stopped breastfeeding and you have not had a menstrual period for 12 weeks after you stopped breastfeeding. Get help right away if:  You have:  Pain that does not go away or does not get better with medicine.  Chest pain.  Difficulty breathing.  Blurred vision or spots in your vision.  Thoughts about hurting yourself or your baby.  You develop pain in your abdomen or in one of your legs.  You develop a severe headache.  You faint.  You bleed from your vagina so much that you fill two sanitary pads in one hour. This information is not intended to replace advice given to you by your health care provider. Make sure you discuss any questions you have with your health care provider. Document Released: 09/15/2000 Document Revised: 03/01/2016 Document Reviewed: 10/03/2015 Elsevier Interactive Patient Education  2017 [employer].

## 2017-02-04 NOTE — Lactation Note (Addendum)
This note was copied from a baby's chart. Lactation Consultation Note  Patient Name: Ann Santiago Reason for consult: Follow-up assessment;Infant < 6lbs;Late preterm infant LC assisted Mom with latching baby. At the beginning of the feeding, lots of stimulation needed to keep baby awake at breast. As feeding progressed baby became more engaged demonstrating good suckling bursts with some swallows. Reviewed with parents again, LPT behaviors and need to wake baby to BF. Advised baby should BF with feeding ques but at least every 3 hours. Keep baby nursing on 1st breast for up to 30 minutes so baby gets the high fat milk as Mom's milk comes to volume. This will also conserve baby's calorie usage with BF. Mom to post pump for 15 minutes every 3 hours while FOB gives supplement per LPT policy. [facility]. OP f/u with lactation scheduled for Tuesday, 02/06/17 at 8:30. Breast milk storage discussed, engorgement care reviewed if needed. Mom to call for questions/concerns.   Maternal Data    Feeding Feeding Type: Breast Fed Length of feed: 20 min  LATCH Score/Interventions Latch: Repeated attempts needed to sustain latch, nipple held in mouth throughout feeding, stimulation needed to elicit sucking reflex. Intervention(s): Adjust position;Assist with latch;Breast massage;Breast compression  Audible Swallowing: Spontaneous and intermittent  Type of Nipple: Everted at rest and after stimulation  Comfort (Breast/Nipple): Filling, red/small blisters or bruises, mild/mod discomfort  Problem noted: Filling  Hold (Positioning): Assistance needed to correctly position infant at breast and maintain latch. Intervention(s): Breastfeeding basics reviewed;Support Pillows;Position options;Skin to skin  LATCH Score: 7  Lactation Tools Discussed/Used Tools: Pump Breast pump type: Double-Electric Breast  Pump   Consult Status Consult Status: Complete Date: 02/04/17 Follow-up type: In-patient    Ann Santiago, Ann Santiago Ann Santiago, 9:57 AM

## 2017-02-05 ENCOUNTER — Encounter (HOSPITAL_COMMUNITY): Payer: Self-pay | Admitting: Obstetrics and Gynecology

## 2017-02-05 ENCOUNTER — Encounter (HOSPITAL_COMMUNITY): Payer: Self-pay

## 2017-02-06 ENCOUNTER — Encounter: Payer: Medicaid Other | Admitting: Obstetrics

## 2017-02-06 ENCOUNTER — Ambulatory Visit: Payer: Self-pay

## 2017-02-06 NOTE — Lactation Note (Signed)
This note was copied from a baby's chart. Lactation Consult  Mother's reason for visit: follow up from hospital discharge 35.6 weeks Visit Type:  Feeding assessment Appointment Notes: Infant has jaundice with a level of 11.5, mother goes today for repeat labs Consult:  Initial Lactation Consultant:  Michel Bickers  ________________________________________________________________________    ________________________________________________________________________  Mother's Name: Ann Santiago Type of delivery:  Vaginal, Spontaneous Delivery Breastfeeding Experience:  4 months with 2 other children Maternal Medical Conditions:  none Maternal Medications:  Prenatal vits  ________________________________________________________________________  Breastfeeding History (Post Discharge)  Frequency of breastfeeding: every 2 hours Duration of feeding: 20-30 mins  Supplementation  Formula:  Volume 15 ml Frequency: every 2 hours         Brand: Neosure  Breastmilk:  Volume 4 ounces Frequency: 4 times dailly   Method:  Bottle,   Pumping  Type of pump:  Symphony Frequency:  4 times daily Volume: 4 ounces  Infant Intake and Output Assessment  Voids: 4  in 24 hrs.  Color:  Clear yellow Stools:  2 in 24 hrs.  Color:  Brown and Yellow  ________________________________________________________________________  Maternal Breast Assessment  mins Breast:  Full Nipple:  Erect Pain level:  0 Pain interventions:  Bra  _______________________________________________________________________ Feeding Assessment/Evaluation  Initial feeding assessment: Mother latched infant on the left breast independently. Latch was wide and covered entire areola. Observed infant with good suckling pattern. Mother taught breast compression.   Infant's oral assessment:  Variance  Positioning:  Cradle Left breast  LATCH documentation:  Latch:  2 = Grasps breast easily, tongue down, lips  flanged, rhythmical sucking.  Audible swallowing:  2 = Spontaneous and intermittent  Type of nipple:  2 = Everted at rest and after stimulation  Comfort (Breast/Nipple):  1 = Filling, red/small blisters or bruises, mild/mod discomfort  Hold (Positioning):  2 = No assistance needed to correctly position infant at breast  LATCH score:  9  Attached assessment:  Deep  Lips flanged:  Yes.    Lips untucked:  Yes.    Suck assessment:  Displays both    Pre-feed weight: 5-8.9, 2520 Post-feed weight: 5-8.9, 2526  Amount transferred: 6 ml :    Additional Feeding Assessment -mother latched infant on the right breast in cross cradle hold. Infant sleepy at the breast , infant was observed with a few sucks.   Infant's oral assessment:  Variance, high palate  Positioning:  Cradle Right breast  LATCH documentation:  Latch:  2 = Grasps breast easily, tongue down, lips flanged, rhythmical sucking.  Audible swallowing:  2 = Spontaneous and intermittent  Type of nipple:  2 = Everted at rest and after stimulation  Comfort (Breast/Nipple):  1 = Filling, red/small blisters or bruises, mild/mod discomfort  Hold (Positioning):  2 = No assistance needed to correctly position infant at breast  LATCH score:  9  Attached assessment:  Deep  Lips flanged:  Yes.    Lips untucked:  Yes.    Suck assessment:  Displays both   Pre-feed weight:  5-9.1, 2526 Post-feed weight:  5-9.4, 2334 Amount transferred:8 ml   Total amount transferred:14 ml  Total supplement given: 35-40 ml from bottle of ebm.  Advised mother to continue to offer breast before bottle feeding. Mother to wake infant well and offer alternate breast if infant is still sleepy Supplement infant with ebm/formula give 30-45 ml after each feeding.  Mother to use Neosure as directed by Peds.  Pump for 15-20 mins at least  4-6 times daily Mother to continue to nap and take prenatal vits LC appt on May 15 at 11;30 Call [facility] office prn

## 2017-03-08 ENCOUNTER — Encounter: Payer: Self-pay | Admitting: *Deleted

## 2017-03-08 ENCOUNTER — Ambulatory Visit (INDEPENDENT_AMBULATORY_CARE_PROVIDER_SITE_OTHER): Payer: Self-pay | Admitting: Obstetrics and Gynecology

## 2017-03-08 ENCOUNTER — Encounter: Payer: Self-pay | Admitting: Obstetrics and Gynecology

## 2017-03-08 DIAGNOSIS — O0930 Supervision of pregnancy with insufficient antenatal care, unspecified trimester: Secondary | ICD-10-CM

## 2017-03-08 NOTE — Progress Notes (Signed)
Post Partum Exam  Ann Santiago is a 26 y.o. [phone] female who presents for a postpartum visit. She is 6 weeks postpartum following a SVD. I have fully reviewed the prenatal and intrapartum course. The delivery was at 35w 6d gestational weeks due to preterm labor.  Anesthesia: none. Postpartum course has been unremarkable. Baby's course has been unremarkable. Baby is feeding by breast and Neosure Formula. Bleeding no bleeding. Bowel function is normal. Bladder function is normal. Patient is not sexually active. Contraception method is tubal ligation. Postpartum depression screening:neg. EPDS: 0     Review of Systems Pertinent items are noted in HPI.    Objective:  unknown if currently breastfeeding.  General:  alert, cooperative and no distress   Breasts:  inspection negative, no nipple discharge or bleeding, no masses or nodularity palpable  Lungs: clear to auscultation bilaterally  Heart:  regular rate and rhythm  Abdomen: soft, non-tender; bowel sounds normal; no masses,  no organomegaly   Vulva:  normal  Vagina: not evaluated  Cervix:  multiparous appearance  Corpus: normal size, contour, position, consistency, mobility, non-tender  Adnexa:  normal adnexa and no mass, fullness, tenderness  Rectal Exam: Not performed.        Assessment:    Normal postpartum exam. Normal pap smear 12/06/16.    Plan:   1. Contraception: tubal ligation 2. Patient is medically cleared to resume all activities of daily living. She plans to return to work next week 3. Follow up in: 1 year  or as needed.

## 2017-04-12 NOTE — Anesthesia Postprocedure Evaluation (Signed)
Anesthesia Post Note  Patient: Ann Santiago  Procedure(s) Performed: Procedure(s) (LRB): POST PARTUM TUBAL LIGATION (Bilateral)     Anesthesia Post Evaluation  Last Vitals:  Vitals:   02/03/17 1719 02/04/17 0616  BP: (!) 106/50 102/60  Pulse: 75 66  Resp: 18 18  Temp: 36.9 C 36.7 C    Last Pain:  Vitals:   02/04/17 0900  TempSrc:   PainSc: Asleep                 Phillips Groutarignan, Zylon Creamer

## 2017-04-12 NOTE — Addendum Note (Signed)
Addendum  created 04/12/17 1429 by Darnita Woodrum, MD   Sign clinical note    

## 2017-05-27 IMAGING — US US PELVIS COMPLETE
1 series · 14 of 25 positions shown · non-contrast
Comparison: None

CLINICAL DATA: Pelvic pain in female.  Lost IUD string.

EXAM:
TRANSABDOMINAL AND TRANSVAGINAL ULTRASOUND OF PELVIS
TECHNIQUE: Both transabdominal and transvaginal ultrasound examinations of the
pelvis were performed. Transabdominal technique was performed for
global imaging of the pelvis including uterus, ovaries, adnexal
regions, and pelvic cul-de-sac. It was necessary to proceed with
endovaginal exam following the transabdominal exam to visualize the
IUD and ovaries.

[Series 1: us pelvis complete · 0.30mm/px · 14 of 67 slices shown]
[im 1/67]
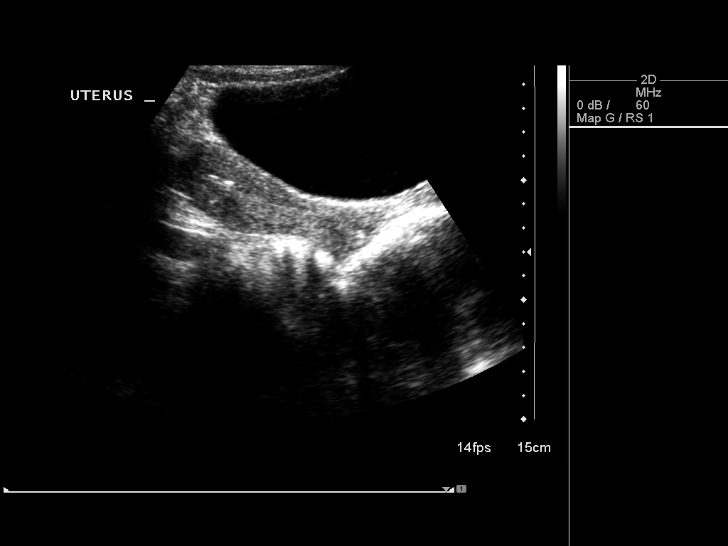
[im 6/67]
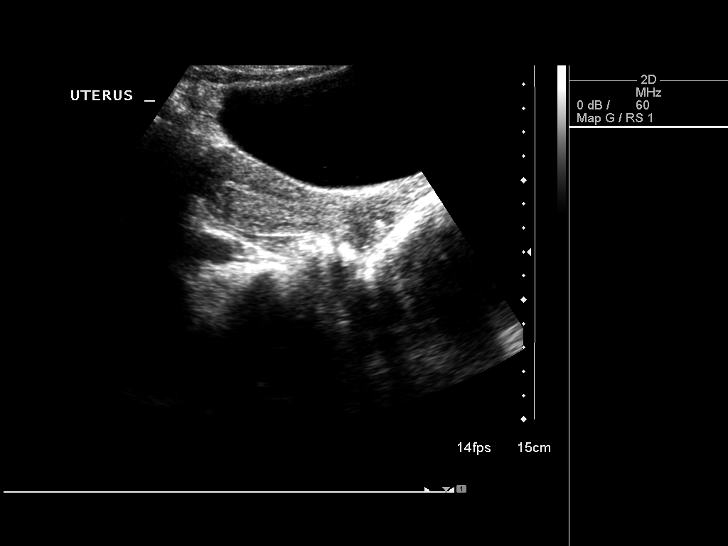
[im 12/67]
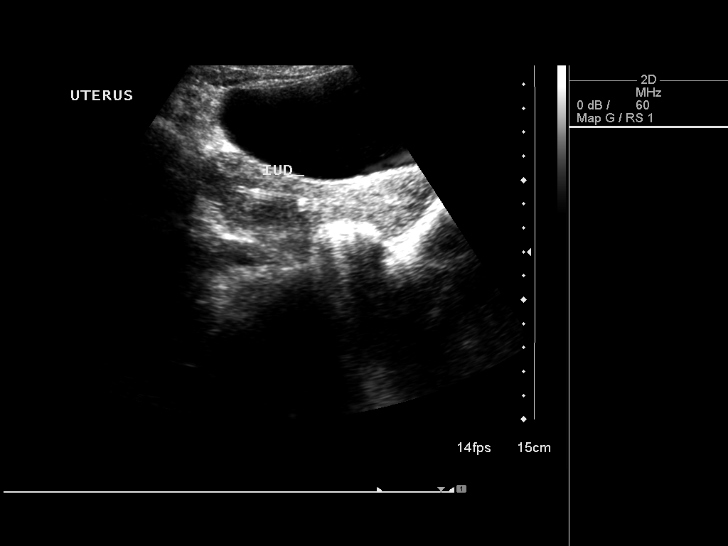
[im 17/67]
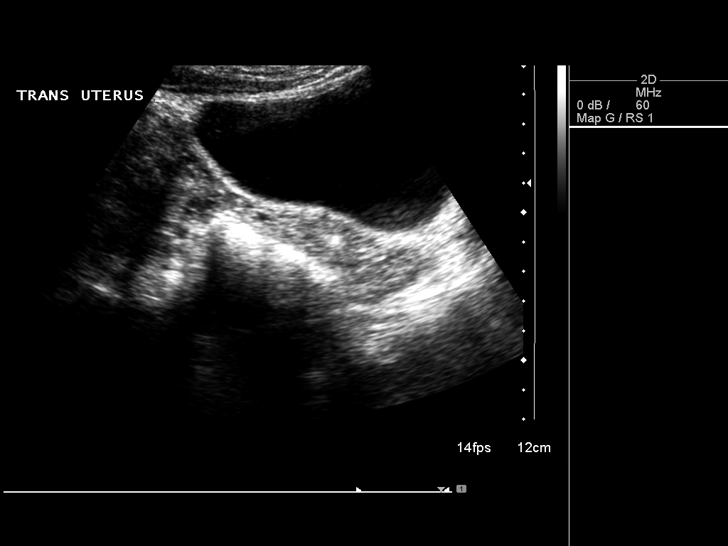
[im 23/67]
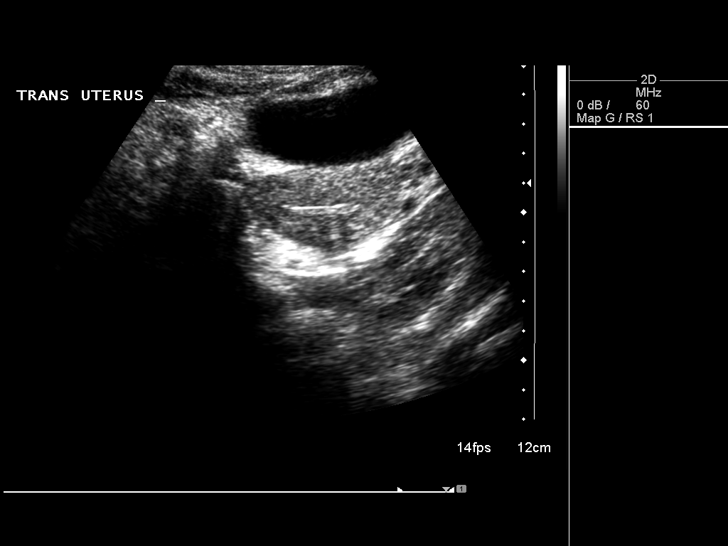
[im 25/67]
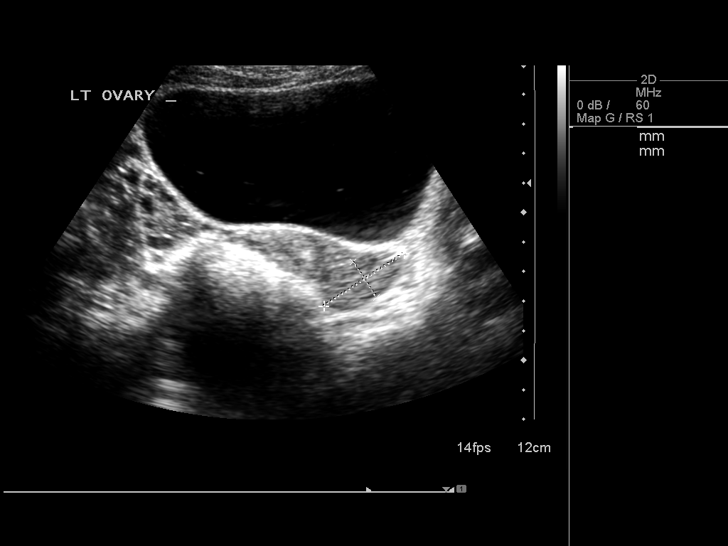
[im 31/67]
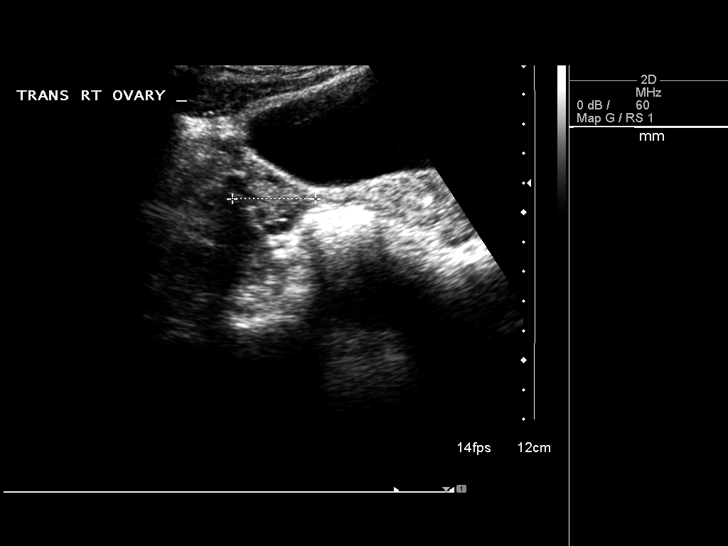
[im 36/67]
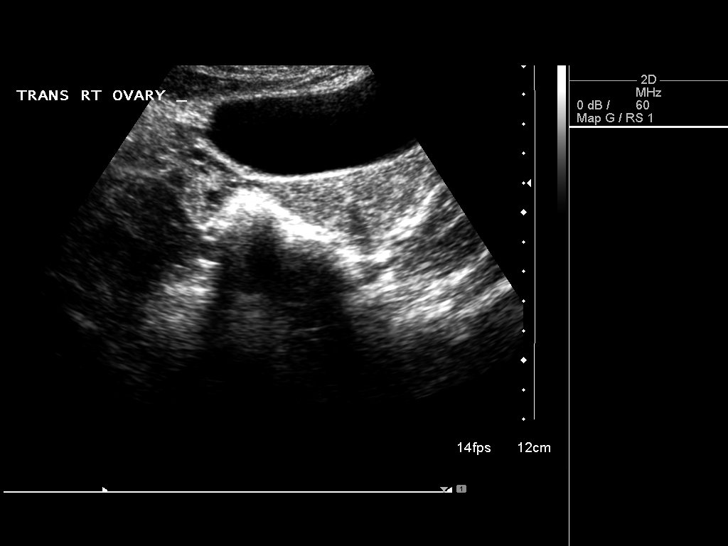
[im 42/67]
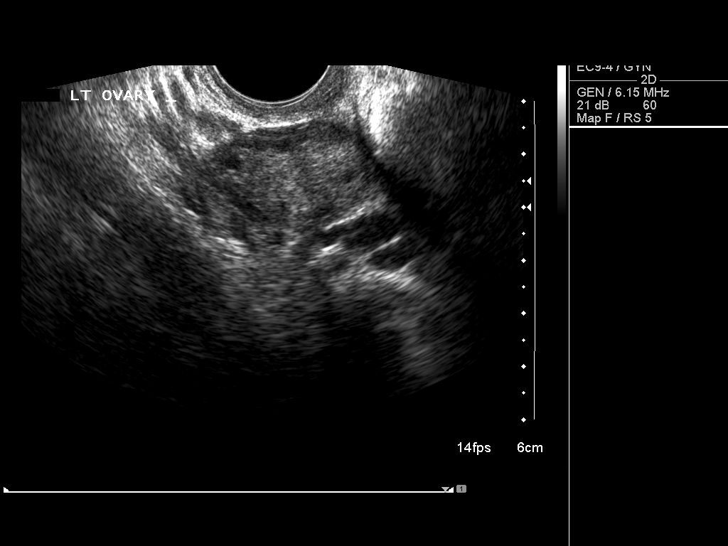
[im 45/67]
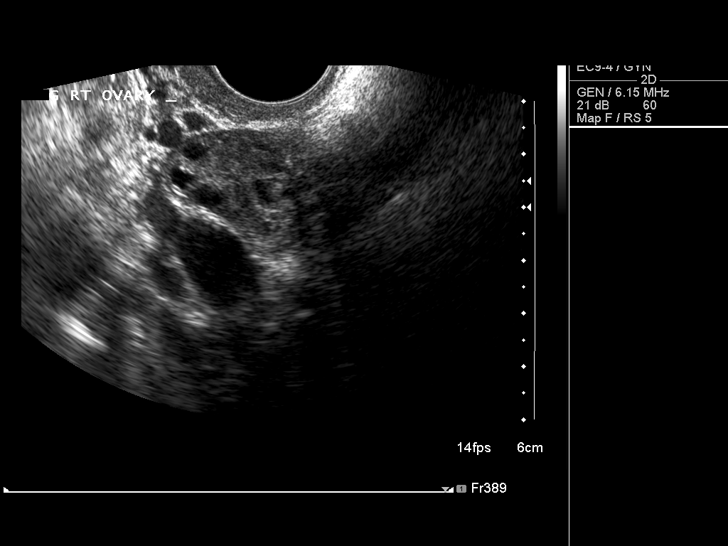
[im 50/67]
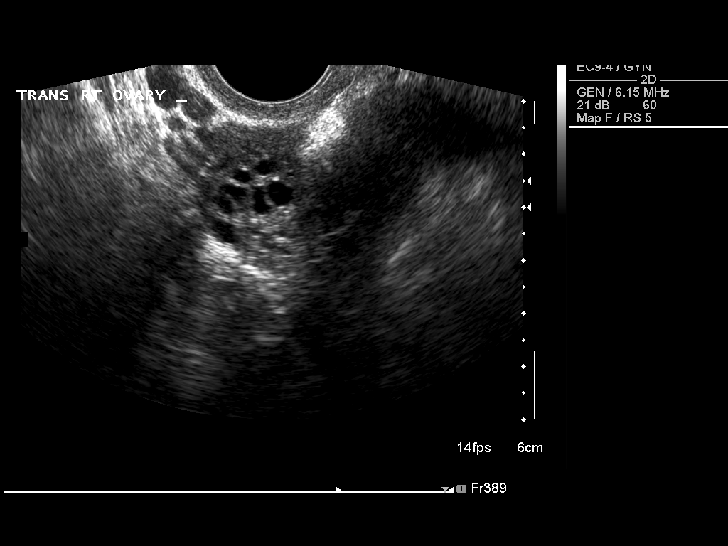
[im 56/67]
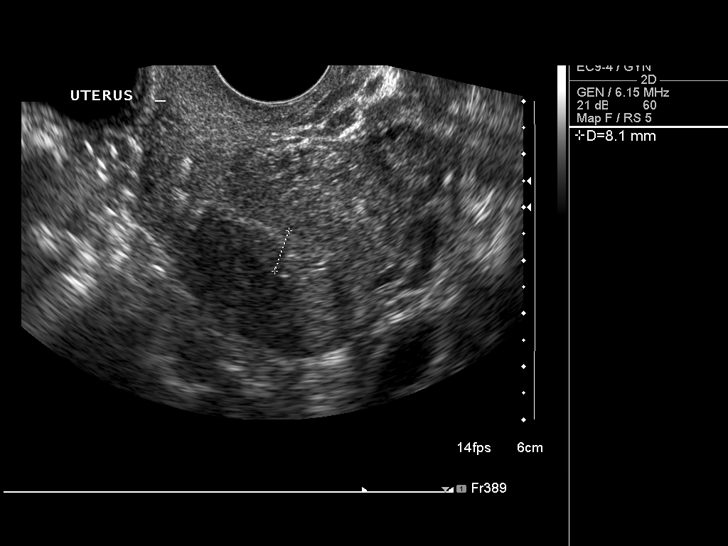
[im 61/67]
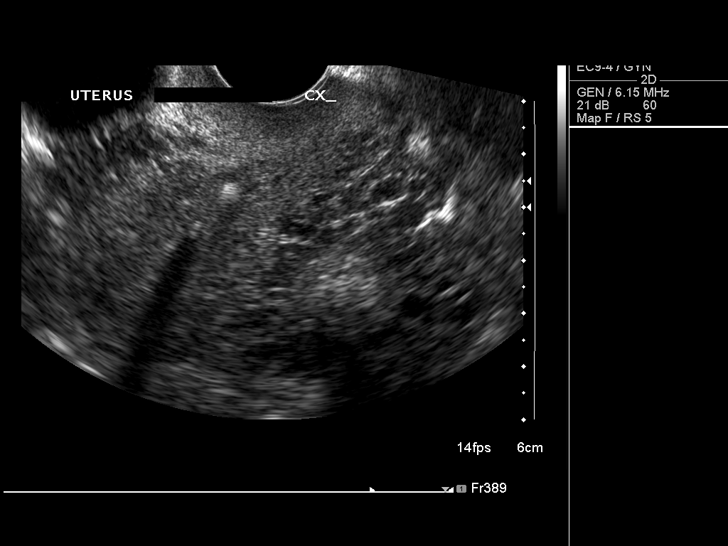
[im 67/67]
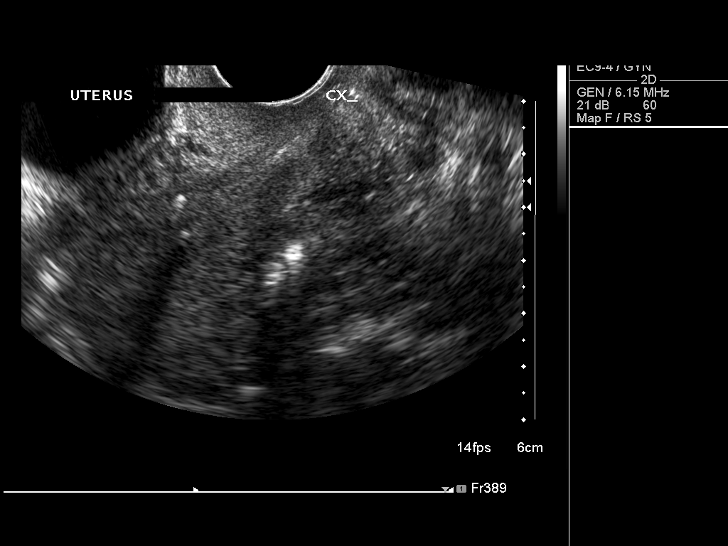

[14 of 25 positions shown; findings below may reference images not displayed]

FINDINGS: Uterus

Measurements: 8.7 x 3.1 x 5.2 cm. Retroverted. No fibroids or other
mass visualized.

Endometrium

Thickness: 8 mm.  IUD visualized within the endometrial cavity.

Right ovary

Measurements: 3.1 x 1.9 x 2.3 cm. Normal appearance/no adnexal mass.

Left ovary

Measurements: 3.4 x 2.0 x 2.1 cm. Normal appearance/no adnexal mass.

Other findings

No free fluid.
IMPRESSION: Retroverted uterus. IUD seen within endometrial cavity. No fibroids
or other significant abnormality identified.

Normal appearance of both ovaries.

## 2017-06-25 ENCOUNTER — Encounter (HOSPITAL_COMMUNITY): Payer: Self-pay

## 2017-07-18 ENCOUNTER — Encounter (HOSPITAL_COMMUNITY): Payer: Self-pay | Admitting: Family Medicine

## 2017-07-18 ENCOUNTER — Ambulatory Visit (HOSPITAL_COMMUNITY)
Admission: EM | Admit: 2017-07-18 | Discharge: 2017-07-18 | Disposition: A | Discharge status: Home / Self Care | Payer: Self-pay | Attending: Family Medicine | Admitting: Family Medicine

## 2017-07-18 DIAGNOSIS — N3090 Cystitis, unspecified without hematuria: Secondary | ICD-10-CM | POA: Insufficient documentation

## 2017-07-18 LAB — POCT PREGNANCY, URINE: PREG TEST UR: NEGATIVE

## 2017-07-18 MED ORDER — SULFAMETHOXAZOLE-TRIMETHOPRIM 800-160 MG PO TABS: 1.0000 | ORAL_TABLET | Freq: Two times a day (BID) | ORAL | 0 refills | Status: DC

## 2017-07-18 NOTE — ED Provider Notes (Signed)
  [facility]    ASSESSMENT & PLAN:  1. Cystitis     Meds ordered this encounter  Medications  . sulfamethoxazole-trimethoprim (BACTRIM DS,SEPTRA DS) 800-160 MG tablet    Sig: Take 1 tablet by mouth 2 (two) times daily.    Dispense:  10 tablet    Refill:  0   Urine culture sent. Will notify patient when results available. Will follow up with her PCP or here if not showing improvement over the next 48 hours, sooner if needed.  Outlined signs and symptoms indicating need for more acute intervention. Patient verbalized understanding. After Visit Summary given.  SUBJECTIVE:  Ann Santiago is a 26 y.o. female who complains of urinary frequency, urgency and dysuria for the past several days. No flank pain, fever, chills, abnormal vaginal discharge or bleeding. Hematuria: not present.  Normal PO intake. No flank or abdominal pain. No self treatment.  LMP: Patient's last menstrual period was 06/26/2017.  ROS: As in HPI.  OBJECTIVE:  Vitals:   07/18/17 1827  BP: 131/78  Pulse: 65  Resp: 18  Temp: 98 F (36.7 C)  SpO2: 100%   Appears well, in no apparent distress. Abdomen is soft without tenderness, guarding, mass, rebound or organomegaly. No CVA tenderness or inguinal adenopathy noted.  Labs Reviewed  URINE CULTURE    No Known Allergies  Past Medical History:  Diagnosis Date  . Anemia   . Headache   . History of chlamydia   . Preterm labor 02/01/2017  . Vaginal Pap smear, abnormal    Social History   Social History  . Marital status: Single    Spouse name: N/A  . Number of children: N/A  . Years of education: N/A   Occupational History  . Not on file.   Social History Main Topics  . Smoking status: Never Smoker  . Smokeless tobacco: Never Used  . Alcohol use No     Comment: social  . Drug use: No  . Sexual activity: Yes    Partners: Male    Birth control/ protection: Surgical   Other Topics Concern  . Not on file   Social History  Narrative  . No narrative on file   Family History  Problem Relation Age of Onset  . Hypertension Maternal Dulce SellarGrandmother        Rikki Smestad, MD 07/18/17 Paulo [id number]

## 2017-07-18 NOTE — ED Notes (Signed)
Urine test exceeded the limits of the clinitex urine test. Urine culture ordered. Reported to provider

## 2017-07-18 NOTE — ED Triage Notes (Signed)
Pt here for UTI symptoms taking AZO.

## 2017-07-21 LAB — URINE CULTURE: Culture: >=100000 — AB

## 2017-07-22 ENCOUNTER — Telehealth (HOSPITAL_COMMUNITY): Payer: Self-pay | Admitting: Internal Medicine

## 2017-07-22 MED ORDER — CEPHALEXIN 500 MG PO CAPS: 500.0000 mg | ORAL_CAPSULE | Freq: Two times a day (BID) | ORAL | 0 refills | Status: AC

## 2017-07-22 NOTE — Telephone Encounter (Signed)
Please let patient know that urine culture was positive for E coli germ, resistant to trimethoprim/sulfa but sensitive to cephalexin.  Stop trimethoprim/sulfa.  Rx cephalexin sent to the pharmacy of record, [facility] on [address].  Recheck for further evaluation if symptoms are not improving.  LM

## 2017-07-24 ENCOUNTER — Encounter (HOSPITAL_COMMUNITY): Payer: Self-pay | Admitting: Emergency Medicine

## 2017-07-24 ENCOUNTER — Ambulatory Visit (HOSPITAL_COMMUNITY)
Admission: EM | Admit: 2017-07-24 | Discharge: 2017-07-24 | Disposition: A | Discharge status: Home / Self Care | Payer: Self-pay | Attending: Internal Medicine | Admitting: Internal Medicine

## 2017-07-24 DIAGNOSIS — R110 Nausea: Secondary | ICD-10-CM

## 2017-07-24 DIAGNOSIS — R531 Weakness: Secondary | ICD-10-CM

## 2017-07-24 DIAGNOSIS — E860 Dehydration: Secondary | ICD-10-CM

## 2017-07-24 DIAGNOSIS — R51 Headache: Secondary | ICD-10-CM

## 2017-07-24 MED ORDER — DIPHENHYDRAMINE HCL 50 MG/ML IJ SOLN
INTRAMUSCULAR | Status: AC
  Filled 2017-07-24: qty 1

## 2017-07-24 MED ORDER — CETIRIZINE-PSEUDOEPHEDRINE ER 5-120 MG PO TB12: 1.0000 | ORAL_TABLET | Freq: Every day | ORAL | 0 refills | Status: DC

## 2017-07-24 MED ORDER — KETOROLAC TROMETHAMINE 30 MG/ML IJ SOLN
INTRAMUSCULAR | Status: AC
  Filled 2017-07-24: qty 1

## 2017-07-24 MED ORDER — KETOROLAC TROMETHAMINE 30 MG/ML IJ SOLN
30.0000 mg | Freq: Once | INTRAMUSCULAR | Status: AC
  Administered 2017-07-24: 30 mg via INTRAMUSCULAR

## 2017-07-24 MED ORDER — FLUTICASONE PROPIONATE 50 MCG/ACT NA SUSP: 2.0000 | Freq: Every day | NASAL | 0 refills | Status: DC

## 2017-07-24 MED ORDER — DIPHENHYDRAMINE HCL 50 MG/ML IJ SOLN
12.5000 mg | Freq: Once | INTRAMUSCULAR | Status: AC
  Administered 2017-07-24: 12.5 mg via INTRAMUSCULAR

## 2017-07-24 NOTE — ED Provider Notes (Signed)
[facility]    CSN: [id number] Arrival date & time: 07/24/17  4098     History   Chief Complaint Chief Complaint  Patient presents with  . Fatigue  . Abnormal Lab    HPI Ann Santiago is a 26 y.o. female.   26 year old female comes in with 3 day history of feeling faint when she stands up. Generalized weakness. Tmax 104.6 (under tongue), did not take anything for it. Has had some nausea without vomiting. She has had 2 week history of headache, ibuprofen 400mg  twice without relief. Headache is bilateral temporal that is "cramping" in nature with photophobia and phonophobia. States pain is constant and nagging. Denies syncope, chest pain, shortness of breath, seizure/tremors. Denies URI symptoms such as cough, congestion, sore throat. Denies abdominal pain.   Patient was seen and treated for cystitis 07/18/2017 and was given bactrim. She was told yesterday to switch to keflex due to culture results. Urinary symptoms has improved since yesterdday starting the medicine.       Past Medical History:  Diagnosis Date  . Anemia   . Headache   . History of chlamydia   . Preterm labor 02/01/2017  . Vaginal Pap smear, abnormal     Patient Active Problem List   Diagnosis Date Noted  . Normal vaginal delivery 02/04/2017  . Preterm labor 02/01/2017  . Normal labor 02/01/2017  . Late prenatal care, antepartum 11/22/2016    Past Surgical History:  Procedure Laterality Date  . TUBAL LIGATION Bilateral 02/02/2017   Procedure: POST PARTUM TUBAL LIGATION;  Surgeon: [place] Bing, MD;  Location: [facility]IRTHING SU[facility];  Service: Gynecology;  Laterality: Bilateral;  . WISDOM TOOTH EXTRACTION      OB History    Gravida Para Term Preterm AB Living   4 4 3 1   4    SAB TAB Ectopic Multiple Live Births         0 4       Home Medications    Prior to Admission medications   Medication Sig Start Date End Date Taking? Authorizing Provider  cephALEXin (KEFLEX) 500 MG capsule  Take 1 capsule (500 mg total) by mouth 2 (two) times daily. 07/22/17 07/27/17 Yes Eustace Moore, MD  cetirizine-pseudoephedrine (ZYRTEC-D) 5-120 MG tablet Take 1 tablet by mouth daily. 07/24/17   Cathie Hoops, Tarry Blayney V, PA-C  fluticasone (FLONASE) 50 MCG/ACT nasal spray Place 2 sprays into both nostrils daily. 07/24/17   Belinda Fisher, PA-C    Family History Family History  Problem Relation Age of Onset  . Hypertension Maternal Grandmother     Social History Social History  Substance Use Topics  . Smoking status: Never Smoker  . Smokeless tobacco: Never Used  . Alcohol use No     Comment: social     Allergies   Patient has no known allergies.   Review of Systems Review of Systems   Physical Exam Triage Vital Signs ED Triage Vitals  Enc Vitals Group     BP 07/24/17 1012 101/67     Pulse Rate 07/24/17 1012 82     Resp 07/24/17 1012 18     Temp 07/24/17 1012 98.2 F (36.8 C)     Temp Source 07/24/17 1012 Oral     SpO2 07/24/17 1012 98 %     Weight --      Height --      Head Circumference --      Peak Flow --  Pain Score 07/24/17 1013 8     Pain Loc --      Pain Edu? --      Excl. in GC? --    Orthostatic VS for the past 24 hrs:  BP- Lying Pulse- Lying BP- Sitting Pulse- Sitting BP- Standing at 0 minutes Pulse- Standing at 0 minutes  07/24/17 1042 98/59 74 101/69 86 99/63 100    Updated Vital Signs BP 101/67   Pulse 82   Temp 98.2 F (36.8 C) (Oral)   Resp 18   LMP 06/26/2017   SpO2 98%   Physical Exam  Constitutional: She is oriented to person, place, and time. She appears well-developed and well-nourished. No distress.  HENT:  Head: Normocephalic and atraumatic.  Right Ear: External ear and ear canal normal. Tympanic membrane is erythematous. Tympanic membrane is not bulging.  Left Ear: Tympanic membrane, external ear and ear canal normal. Tympanic membrane is not erythematous and not bulging.  Nose: Mucosal edema and rhinorrhea present. Right sinus exhibits  maxillary sinus tenderness and frontal sinus tenderness. Left sinus exhibits maxillary sinus tenderness and frontal sinus tenderness.  Mouth/Throat: Uvula is midline, oropharynx is clear and moist and mucous membranes are normal.  Eyes: Pupils are equal, round, and reactive to light. Conjunctivae and EOM are normal.  Neck: Normal range of motion. Neck supple.  Cardiovascular: Normal rate, regular rhythm and normal heart sounds.  Exam reveals no gallop and no friction rub.   No murmur heard. Pulmonary/Chest: Effort normal and breath sounds normal. She has no decreased breath sounds. She has no wheezes. She has no rhonchi. She has no rales.  Abdominal: Soft. Bowel sounds are normal. She exhibits no mass. There is no tenderness. There is no rebound and no guarding.  Lymphadenopathy:    She has no cervical adenopathy.  Neurological: She is alert and oriented to person, place, and time. She has normal strength. She is not disoriented. No cranial nerve deficit or sensory deficit. She displays a negative Romberg sign. GCS eye subscore is 4. GCS verbal subscore is 5. GCS motor subscore is 6.  Normal rapid movements, finger to nose.   Skin: Skin is warm and dry.  Psychiatric: She has a normal mood and affect. Her behavior is normal. Judgment normal.     UC Treatments / Results  Labs (all labs ordered are listed, but only abnormal results are displayed) Labs Reviewed - No data to display  EKG  EKG Interpretation None       Radiology No results found.  Procedures Procedures (including critical care time)  Medications Ordered in UC Medications  ketorolac (TORADOL) 30 MG/ML injection 30 mg (30 mg Intramuscular Given 07/24/17 1111)  diphenhydrAMINE (BENADRYL) injection 12.5 mg (12.5 mg Intramuscular Given 07/24/17 1111)     Initial Impression / Assessment and Plan / UC Course  I have reviewed the triage vital signs and the nursing notes.  Pertinent labs & imaging results that were  available during my care of the patient were reviewed by me and considered in my medical decision making (see chart for details).    Discussed possible causes of symptoms such as dehydration, viral illness, cystitis. Continue abx as directed. Flonase/zyrtec-D for nasal congestion. Toradol and benadryl injection in office for headache. Positive orthostatics, patient tolerates PO intake, will have patient push fluids. Return precautions given. Patient and fiance expresses understanding and agrees to plan.   Final Clinical Impressions(s) / UC Diagnoses   Final diagnoses:  Weakness  Dehydration  New Prescriptions New Prescriptions   CETIRIZINE-PSEUDOEPHEDRINE (ZYRTEC-D) 5-120 MG TABLET    Take 1 tablet by mouth daily.   FLUTICASONE (FLONASE) 50 MCG/ACT NASAL SPRAY    Place 2 sprays into both nostrils daily.      Belinda Fisher, PA-C 07/24/17 1114

## 2017-07-24 NOTE — Discharge Instructions (Signed)
Symptoms can be due to dehydration, urinary tract infection, viral illness. Continue antibiotics as directed. Toradol and benadryl injection in office today. Keep hydrated, your urine should be clear to pale yellow in color. Avoid caffeine such as soda, coffee, tea, and avoid alcohol for now to avoid further dehydration. Start flonase, zyrtec-D for nasal congestion. You can use over the counter nasal saline rinse such as neti pot for nasal congestion. Monitor for worsening of symptoms, passing out, confusion/altered mental status, one sided weakness, go to the emergency department for further evaluation.

## 2017-07-24 NOTE — ED Triage Notes (Signed)
C/o fatigue for few days, states someone from the lab called her about her urine sample stating she had "e coli poisoning".

## 2017-12-21 ENCOUNTER — Ambulatory Visit: Payer: Self-pay | Admitting: Nurse Practitioner

## 2017-12-21 DIAGNOSIS — Z0289 Encounter for other administrative examinations: Secondary | ICD-10-CM

## 2018-03-21 ENCOUNTER — Encounter (INDEPENDENT_AMBULATORY_CARE_PROVIDER_SITE_OTHER): Payer: Managed Care, Other (non HMO) | Admitting: Ophthalmology

## 2018-03-21 DIAGNOSIS — H43813 Vitreous degeneration, bilateral: Secondary | ICD-10-CM | POA: Diagnosis not present

## 2018-03-21 DIAGNOSIS — H5213 Myopia, bilateral: Secondary | ICD-10-CM

## 2018-03-21 DIAGNOSIS — H4611 Retrobulbar neuritis, right eye: Secondary | ICD-10-CM | POA: Diagnosis not present

## 2018-10-03 ENCOUNTER — Encounter (HOSPITAL_COMMUNITY): Payer: Self-pay | Admitting: Emergency Medicine

## 2018-10-03 ENCOUNTER — Other Ambulatory Visit: Payer: Self-pay

## 2018-10-03 ENCOUNTER — Ambulatory Visit (HOSPITAL_COMMUNITY)
Admission: EM | Admit: 2018-10-03 | Discharge: 2018-10-03 | Disposition: A | Discharge status: Home / Self Care | Payer: Self-pay | Attending: Family Medicine | Admitting: Family Medicine

## 2018-10-03 DIAGNOSIS — B9689 Other specified bacterial agents as the cause of diseases classified elsewhere: Secondary | ICD-10-CM

## 2018-10-03 DIAGNOSIS — N760 Acute vaginitis: Secondary | ICD-10-CM | POA: Insufficient documentation

## 2018-10-03 MED ORDER — RIZATRIPTAN BENZOATE 5 MG PO TABS: 5.0000 mg | ORAL_TABLET | ORAL | 0 refills | Status: DC | PRN

## 2018-10-03 MED ORDER — METRONIDAZOLE 500 MG PO TABS: 500.0000 mg | ORAL_TABLET | Freq: Two times a day (BID) | ORAL | 0 refills | Status: DC

## 2018-10-03 NOTE — ED Provider Notes (Signed)
[facility]    CSN: [id number] Arrival date & time: 10/03/18  1809     History   Chief Complaint Chief Complaint  Patient presents with  . Vaginal Discharge    HPI Ann Santiago is a 28 y.o. female.   She presents with 2 problems first is a malodorous vaginal discharge is been present about 2 weeks.  There is no itching.  There is no history of STDs. Also complains of headaches has recently gotten over sinus infection but has what she describes as migraines.  These seem to be related to stress as well as periods.  She has an aura and has nausea and vomiting with the headaches.  She has never seen a physician for these headaches  HPI  Past Medical History:  Diagnosis Date  . Anemia   . Headache   . History of chlamydia   . Preterm labor 02/01/2017  . Vaginal Pap smear, abnormal     Patient Active Problem List   Diagnosis Date Noted  . Normal vaginal delivery 02/04/2017  . Preterm labor 02/01/2017  . Normal labor 02/01/2017  . Late prenatal care, antepartum 11/22/2016    Past Surgical History:  Procedure Laterality Date  . TUBAL LIGATION Bilateral 02/02/2017   Procedure: POST PARTUM TUBAL LIGATION;  Surgeon: [place] Bing, MD;  Location: [facility];  Service: Gynecology;  Laterality: Bilateral;  . WISDOM TOOTH EXTRACTION      OB History    Gravida  4   Para  4   Term  3   Preterm  1   AB      Living  4     SAB      TAB      Ectopic      Multiple  0   Live Births  4            Home Medications    Prior to Admission medications   Medication Sig Start Date End Date Taking? Authorizing Provider  cetirizine-pseudoephedrine (ZYRTEC-D) 5-120 MG tablet Take 1 tablet by mouth daily. 07/24/17   Cathie Hoops, Amy V, PA-C  fluticasone (FLONASE) 50 MCG/ACT nasal spray Place 2 sprays into both nostrils daily. 07/24/17   Belinda Fisher, PA-C    Family History Family History  Problem Relation Age of Onset  . Hypertension Maternal Grandmother      Social History Social History   Tobacco Use  . Smoking status: Never Smoker  . Smokeless tobacco: Never Used  Substance Use Topics  . Alcohol use: No    Comment: social  . Drug use: No     Allergies   Patient has no known allergies.   Review of Systems Review of Systems  Constitutional: Negative.   HENT: Positive for congestion.   Respiratory: Negative.   Cardiovascular: Negative.   Gastrointestinal: Negative.   Genitourinary: Positive for vaginal discharge.  Neurological: Positive for headaches.     Physical Exam Triage Vital Signs ED Triage Vitals [10/03/18 1925]  Enc Vitals Group     BP 110/78     Pulse Rate 65     Resp 18     Temp 98.2 F (36.8 C)     Temp Source Oral     SpO2 100 %     Weight      Height      Head Circumference      Peak Flow      Pain Score      Pain Loc  Pain Edu?      Excl. in GC?    No data found.  Updated Vital Signs BP 110/78 (BP Location: Left Arm)   Pulse 65   Temp 98.2 F (36.8 C) (Oral)   Resp 18   LMP 08/31/2018   SpO2 100%   Visual Acuity Right Eye Distance:   Left Eye Distance:   Bilateral Distance:    Right Eye Near:   Left Eye Near:    Bilateral Near:     Physical Exam HENT:     Nose: Nose normal.  Cardiovascular:     Rate and Rhythm: Normal rate and regular rhythm.  Pulmonary:     Effort: Pulmonary effort is normal.     Breath sounds: Normal breath sounds.  Abdominal:     General: Bowel sounds are normal.     Palpations: Abdomen is soft.  Neurological:     General: No focal deficit present.     Mental Status: She is alert and oriented to person, place, and time.  Psychiatric:        Behavior: Behavior normal.      UC Treatments / Results  Labs (all labs ordered are listed, but only abnormal results are displayed) Labs Reviewed - No data to display  EKG None  Radiology No results found.  Procedures Procedures (including critical care time)  Medications Ordered in  UC Medications - No data to display  Initial Impression / Assessment and Plan / UC Course  I have reviewed the triage vital signs and the nursing notes.  Pertinent labs & imaging results that were available during my care of the patient were reviewed by me and considered in my medical decision making (see chart for details).     Vaginitis.  Based on symptoms is likely BV.  Patient not interested in testing Probable migraine headache.  Will try with Rx for Maxalt to take as needed aura Final Clinical Impressions(s) / UC Diagnoses   Final diagnoses:  None   Discharge Instructions   None    ED Prescriptions    None     Controlled Substance Prescriptions [place] Controlled Substance Registry consulted? No   Frederica Kuster, MD 10/03/18 2004

## 2018-10-03 NOTE — ED Triage Notes (Addendum)
PT reports foul odor vaginal discharge for a few weeks. PT has tried to douche without success.   Also complains of headaches. She just got over a sinus infection. OTC meds dont help.

## 2018-11-17 ENCOUNTER — Encounter (HOSPITAL_COMMUNITY): Payer: Self-pay | Admitting: Emergency Medicine

## 2018-11-17 ENCOUNTER — Emergency Department (HOSPITAL_COMMUNITY): Payer: Self-pay

## 2018-11-17 ENCOUNTER — Emergency Department (HOSPITAL_COMMUNITY)
Admission: EM | Admit: 2018-11-17 | Discharge: 2018-11-17 | Disposition: A | Discharge status: Home / Self Care | Payer: Self-pay | Attending: Emergency Medicine | Admitting: Emergency Medicine

## 2018-11-17 DIAGNOSIS — N12 Tubulo-interstitial nephritis, not specified as acute or chronic: Secondary | ICD-10-CM

## 2018-11-17 DIAGNOSIS — Z79899 Other long term (current) drug therapy: Secondary | ICD-10-CM | POA: Insufficient documentation

## 2018-11-17 DIAGNOSIS — N10 Acute tubulo-interstitial nephritis: Secondary | ICD-10-CM | POA: Insufficient documentation

## 2018-11-17 DIAGNOSIS — E876 Hypokalemia: Secondary | ICD-10-CM | POA: Insufficient documentation

## 2018-11-17 LAB — COMPREHENSIVE METABOLIC PANEL
ALBUMIN: 4 g/dL (ref 3.5–5.0)
ALK PHOS: 51 U/L (ref 38–126)
ALT: 26 U/L (ref 0–44)
ANION GAP: 14 (ref 5–15)
AST: 28 U/L (ref 15–41)
BUN: 12 mg/dL (ref 6–20)
CHLORIDE: 100 mmol/L (ref 98–111)
CO2: 20 mmol/L — AB (ref 22–32)
Calcium: 9.3 mg/dL (ref 8.9–10.3)
Creatinine, Ser: 0.85 mg/dL (ref 0.44–1.00)
GFR calc Af Amer: >60 mL/min (ref >60)
GFR calc non Af Amer: >60 mL/min (ref >60)
GLUCOSE: 104 mg/dL — AB (ref 70–99)
Potassium: 3 mmol/L — ABNORMAL LOW (ref 3.5–5.1)
SODIUM: 134 mmol/L — AB (ref 135–145)
Total Bilirubin: 1.1 mg/dL (ref 0.3–1.2)
Total Protein: 7.9 g/dL (ref 6.5–8.1)

## 2018-11-17 LAB — URINALYSIS, ROUTINE W REFLEX MICROSCOPIC
Bilirubin Urine: NEGATIVE
Glucose, UA: NEGATIVE mg/dL
KETONES UR: 20 mg/dL — AB
Nitrite: NEGATIVE
Protein, ur: 30 mg/dL — AB
Specific Gravity, Urine: 1.039 — ABNORMAL HIGH (ref 1.005–1.030)
WBC, UA: >50 WBC/hpf — ABNORMAL HIGH (ref 0–5)
pH: 5 (ref 5.0–8.0)

## 2018-11-17 LAB — CBC WITH DIFFERENTIAL/PLATELET
ABS IMMATURE GRANULOCYTES: 0.17 10*3/uL — AB (ref 0.00–0.07)
BASOS ABS: 0 10*3/uL (ref 0.0–0.1)
Basophils Relative: 0 %
EOS PCT: 0 %
Eosinophils Absolute: 0 10*3/uL (ref 0.0–0.5)
HEMATOCRIT: 36.9 % (ref 36.0–46.0)
HEMOGLOBIN: 12 g/dL (ref 12.0–15.0)
IMMATURE GRANULOCYTES: 1 %
LYMPHS ABS: 0.6 10*3/uL — AB (ref 0.7–4.0)
LYMPHS PCT: 3 %
MCH: 30.1 pg (ref 26.0–34.0)
MCHC: 32.5 g/dL (ref 30.0–36.0)
MCV: 92.5 fL (ref 80.0–100.0)
Monocytes Absolute: 1.2 10*3/uL — ABNORMAL HIGH (ref 0.1–1.0)
Monocytes Relative: 6 %
NEUTROS ABS: 17.4 10*3/uL — AB (ref 1.7–7.7)
NRBC: 0 % (ref 0.0–0.2)
Neutrophils Relative %: 90 %
Platelets: 135 10*3/uL — ABNORMAL LOW (ref 150–400)
RBC: 3.99 MIL/uL (ref 3.87–5.11)
RDW: 13.9 % (ref 11.5–15.5)
WBC: 19.4 10*3/uL — ABNORMAL HIGH (ref 4.0–10.5)

## 2018-11-17 LAB — LIPASE, BLOOD: Lipase: 33 U/L (ref 11–51)

## 2018-11-17 LAB — I-STAT BETA HCG BLOOD, ED (MC, WL, AP ONLY): I-stat hCG, quantitative: <5 m[IU]/mL (ref <5)

## 2018-11-17 LAB — LACTIC ACID, PLASMA: Lactic Acid, Venous: 0.9 mmol/L (ref 0.5–1.9)

## 2018-11-17 MED ORDER — SODIUM CHLORIDE 0.9 % IV BOLUS
1000.0000 mL | Freq: Once | INTRAVENOUS | Status: AC
  Administered 2018-11-17: 1000 mL via INTRAVENOUS

## 2018-11-17 MED ORDER — SODIUM CHLORIDE 0.9 % IV SOLN
1.0000 g | Freq: Once | INTRAVENOUS | Status: AC
  Administered 2018-11-17: 1 g via INTRAVENOUS
  Filled 2018-11-17: qty 10

## 2018-11-17 MED ORDER — KETOROLAC TROMETHAMINE 30 MG/ML IJ SOLN
30.0000 mg | Freq: Once | INTRAMUSCULAR | Status: AC
  Administered 2018-11-17: 30 mg via INTRAVENOUS
  Filled 2018-11-17: qty 1

## 2018-11-17 MED ORDER — IOHEXOL 300 MG/ML  SOLN
100.0000 mL | Freq: Once | INTRAMUSCULAR | Status: AC | PRN
  Administered 2018-11-17: 100 mL via INTRAVENOUS

## 2018-11-17 MED ORDER — ONDANSETRON 4 MG PO TBDP: 4.0000 mg | ORAL_TABLET | Freq: Three times a day (TID) | ORAL | 0 refills | Status: DC | PRN

## 2018-11-17 MED ORDER — CEPHALEXIN 500 MG PO CAPS: 500.0000 mg | ORAL_CAPSULE | Freq: Four times a day (QID) | ORAL | 0 refills | Status: AC

## 2018-11-17 MED ORDER — ACETAMINOPHEN 325 MG PO TABS
650.0000 mg | ORAL_TABLET | Freq: Once | ORAL | Status: AC | PRN
  Administered 2018-11-17: 650 mg via ORAL
  Filled 2018-11-17: qty 2

## 2018-11-17 MED ORDER — ONDANSETRON HCL 4 MG/2ML IJ SOLN
4.0000 mg | Freq: Once | INTRAMUSCULAR | Status: AC
  Administered 2018-11-17: 4 mg via INTRAVENOUS
  Filled 2018-11-17: qty 2

## 2018-11-17 MED ORDER — POTASSIUM CHLORIDE CRYS ER 20 MEQ PO TBCR
40.0000 meq | EXTENDED_RELEASE_TABLET | Freq: Once | ORAL | Status: AC
  Administered 2018-11-17: 40 meq via ORAL
  Filled 2018-11-17: qty 2

## 2018-11-17 MED ORDER — SODIUM CHLORIDE 0.9 % IV BOLUS: 1000.0000 mL | Freq: Once | INTRAVENOUS | Status: DC

## 2018-11-17 NOTE — Discharge Instructions (Addendum)
Pyelonephritis  There is evidence of an infection in the kidney called pyelonephritis.  Antibiotics: Please take all of your antibiotics until finished!   You may develop abdominal discomfort or diarrhea from the antibiotic.  You may help offset this with probiotics which you can buy or get in yogurt. Do not eat or take the probiotics until 2 hours after your antibiotic.   Hydration: Symptoms of any other illness will be intensified and complicated by dehydration. Dehydration can also extend the duration of symptoms. Dehydration typically causes its own symptoms including lightheadedness, nausea, headaches, fatigue increased thirst, and generally feeling unwell. Drink plenty of fluids and get plenty of rest. You should be drinking at least half a liter of water every hour or two to stay hydrated. Electrolyte drinks (ex. Gatorade, Powerade, Pedialyte) are also encouraged. You should be drinking enough fluids to make your urine light yellow, almost clear. If this is not the case, you are not drinking enough water.  Antiinflammatory medications: Take 600 mg of ibuprofen every 6 hours or 440 mg (over the counter dose) to 500 mg (prescription dose) of naproxen every 12 hours for the next 3 days. After this time, these medications may be used as needed for pain. Take these medications with food to avoid upset stomach. Choose only one of these medications, do not take them together. Acetaminophen (generic for Tylenol): Should you continue to have additional pain while taking the ibuprofen or naproxen, you may add in acetaminophen as needed. Your daily total maximum amount of acetaminophen from all sources should be limited to 4000mg /day for persons without liver problems, or 2000mg /day for those with liver problems.  Follow-up: Follow-up with a primary care provider or urologist on this matter.  Return: Return to the ED for worsening pain, persistent vomiting, spreading pain, difficulty urinating, or any other  major concerns.   You also had evidence of low potassium.  Please be sure to stay well-hydrated and increase potassium in your diet.  Follow-up with a primary care provider or other medical professional within a week or two for retesting.

## 2018-11-17 NOTE — ED Triage Notes (Signed)
Pt reports body aches and headache that began yesterday. She denies sore throat, ear pain or nasal congestion. No known sick contacts.

## 2018-11-17 NOTE — ED Notes (Signed)
Patient verbalizes understanding of discharge instructions. Opportunity for questioning and answers were provided. Armband removed by staff, pt discharged from ED.  

## 2018-11-17 NOTE — ED Provider Notes (Signed)
[facility] Provider Note   CSN: [id number] Arrival date & time: 11/17/18  1002     History   Chief Complaint Chief Complaint  Patient presents with  . Generalized Body Aches  . Fever    HPI Ann Santiago is a 28 y.o. female.  HPI   Ann Santiago is a 28 y.o. female, with a history of anemia, chlamydia, and headaches, presenting to the ED accompanied by her mother with fever and chills beginning yesterday.  Accompanied by body aches, headache, urinary frequency, and abdominal pain.  Anorexia beginning 2 days ago. Headache is mostly right-sided, intermittent, moderate, "feels like cramping," nonradiating. Abdominal pain is suprapubic, right lower quadrant, moderate to severe, feels like a cramping, radiating to the back. LMP beginning of Feb. Last food was 2 days ago Denies N/V/C/D, hematochezia/melena, neck pain/stiffness, dysuria, hematuria, abnormal vaginal discharge/bleeding, cough, congestion, sore throat, ear pain, shortness of breath, chest pain, rash, or any other complaints.   Past Medical History:  Diagnosis Date  . Anemia   . Headache   . History of chlamydia   . Preterm labor 02/01/2017  . Vaginal Pap smear, abnormal     Patient Active Problem List   Diagnosis Date Noted  . Normal vaginal delivery 02/04/2017  . Preterm labor 02/01/2017  . Normal labor 02/01/2017  . Late prenatal care, antepartum 11/22/2016    Past Surgical History:  Procedure Laterality Date  . TUBAL LIGATION Bilateral 02/02/2017   Procedure: POST PARTUM TUBAL LIGATION;  Surgeon: [place] BingPickens, Charlie, MD;  [facility];  Service: Gynecology;  Laterality: Bilateral;  . WISDOM TOOTH EXTRACTION       OB History    Gravida  4   Para  4   Term  3   Preterm  1   AB      Living  4     SAB      TAB      Ectopic      Multiple  0   Live Births  4            Home Medications    Prior to Admission medications     Medication Sig Start Date End Date Taking? Authorizing Provider  cephALEXin (KEFLEX) 500 MG capsule Take 1 capsule (500 mg total) by mouth 4 (four) times daily for 10 days. 11/17/18 11/27/18  Wylie Coon C, PA-C  cetirizine-pseudoephedrine (ZYRTEC-D) 5-120 MG tablet Take 1 tablet by mouth daily. 07/24/17   Cathie HoopsYu, Amy V, PA-C  fluticasone (FLONASE) 50 MCG/ACT nasal spray Place 2 sprays into both nostrils daily. 07/24/17   Cathie HoopsYu, Amy V, PA-C  metroNIDAZOLE (FLAGYL) 500 MG tablet Take 1 tablet (500 mg total) by mouth 2 (two) times daily. 10/03/18   Frederica KusterMiller, Stephen M, MD  ondansetron (ZOFRAN ODT) 4 MG disintegrating tablet Take 1 tablet (4 mg total) by mouth every 8 (eight) hours as needed for nausea or vomiting. 11/17/18   Bransyn Adami C, PA-C  rizatriptan (MAXALT) 5 MG tablet Take 1 tablet (5 mg total) by mouth as needed for migraine. May repeat in 2 hours if needed 10/03/18   Frederica KusterMiller, Stephen M, MD    Family History Family History  Problem Relation Age of Onset  . Hypertension Maternal Grandmother     Social History Social History   Tobacco Use  . Smoking status: Never Smoker  . Smokeless tobacco: Never Used  Substance Use Topics  . Alcohol use: No    Comment: social  .  Drug use: No     Allergies   Patient has no known allergies.   Review of Systems Review of Systems  Constitutional: Negative for chills and fever.  HENT: Negative for congestion, sore throat and trouble swallowing.   Respiratory: Negative for cough and shortness of breath.   Cardiovascular: Negative for chest pain.  Gastrointestinal: Positive for abdominal pain. Negative for blood in stool, diarrhea, nausea and vomiting.  Genitourinary: Positive for frequency. Negative for dysuria, hematuria, vaginal bleeding and vaginal discharge.  Musculoskeletal: Positive for back pain. Negative for neck pain and neck stiffness.  Skin: Negative for rash.  Neurological: Positive for headaches. Negative for dizziness, syncope, weakness,  light-headedness and numbness.  All other systems reviewed and are negative.    Physical Exam Updated Vital Signs BP 103/72   Pulse (!) 130   Temp (!) 103.2 F (39.6 C)   Resp 20   SpO2 96%   Physical Exam Vitals signs and nursing note reviewed.  Constitutional:      Appearance: She is well-developed. She is ill-appearing. She is not toxic-appearing or diaphoretic.  HENT:     Head: Normocephalic and atraumatic.     Right Ear: Tympanic membrane, ear canal and external ear normal.     Left Ear: Tympanic membrane, ear canal and external ear normal.     Nose: Nose normal.     Mouth/Throat:     Mouth: Mucous membranes are moist.     Pharynx: Oropharynx is clear.  Eyes:     Extraocular Movements: Extraocular movements intact.     Conjunctiva/sclera: Conjunctivae normal.     Pupils: Pupils are equal, round, and reactive to light.  Neck:     Musculoskeletal: Normal range of motion and neck supple. No neck rigidity or muscular tenderness.  Cardiovascular:     Rate and Rhythm: Regular rhythm. Tachycardia present.     Pulses: Normal pulses.     Heart sounds: Normal heart sounds.  Pulmonary:     Effort: Pulmonary effort is normal. No respiratory distress.     Breath sounds: Normal breath sounds.  Abdominal:     Palpations: Abdomen is soft.     Tenderness: There is abdominal tenderness. There is right CVA tenderness. There is no guarding.    Musculoskeletal:     Right lower leg: No edema.     Left lower leg: No edema.  Lymphadenopathy:     Cervical: No cervical adenopathy.  Skin:    General: Skin is warm and dry.  Neurological:     General: No focal deficit present.     Mental Status: She is alert and oriented to person, place, and time.     Comments: Sensation grossly intact to light touch in the extremities. Strength 5/5 in all extremities. No gait disturbance. Coordination intact. Cranial nerves III-XII grossly intact. No facial droop.   Psychiatric:        Mood and  Affect: Mood and affect normal.        Speech: Speech normal.        Behavior: Behavior normal.      ED Treatments / Results  Labs (all labs ordered are listed, but only abnormal results are displayed) Labs Reviewed  COMPREHENSIVE METABOLIC PANEL - Abnormal; Notable for the following components:      Result Value   Sodium 134 (*)    Potassium 3.0 (*)    CO2 20 (*)    Glucose, Bld 104 (*)    All other components within normal limits  CBC WITH DIFFERENTIAL/PLATELET - Abnormal; Notable for the following components:   WBC 19.4 (*)    Platelets 135 (*)    Neutro Abs 17.4 (*)    Lymphs Abs 0.6 (*)    Monocytes Absolute 1.2 (*)    Abs Immature Granulocytes 0.17 (*)    All other components within normal limits  URINALYSIS, ROUTINE W REFLEX MICROSCOPIC - Abnormal; Notable for the following components:   APPearance HAZY (*)    Specific Gravity, Urine 1.039 (*)    Hgb urine dipstick LARGE (*)    Ketones, ur 20 (*)    Protein, ur 30 (*)    Leukocytes,Ua MODERATE (*)    WBC, UA >50 (*)    Bacteria, UA MANY (*)    All other components within normal limits  URINE CULTURE  LIPASE, BLOOD  LACTIC ACID, PLASMA  I-STAT BETA HCG BLOOD, ED ([facility], [facility], [facility] ONLY)    EKG None  Radiology Ct Abdomen Pelvis W Contrast  Result Date: 11/17/2018 CLINICAL DATA:  Body aches and headaches since yesterday. EXAM: CT ABDOMEN AND PELVIS WITH CONTRAST TECHNIQUE: Multidetector CT imaging of the abdomen and pelvis was performed using the standard protocol following bolus administration of intravenous contrast. CONTRAST:  OMNIPAQUE IOHEXOL 300 MG/ML  SOLN COMPARISON:  Pelvic ultrasound 04/16/2015. FINDINGS: Lower chest: Clear lung bases. No significant pleural or pericardial effusion. Hepatobiliary: Possible mild periportal edema. No focal lesion or abnormal enhancement. No evidence of gallstones, gallbladder wall thickening or biliary dilatation. Pancreas: Unremarkable. No pancreatic ductal dilatation or  surrounding inflammatory changes. Spleen: Normal in size without focal abnormality. Adrenals/Urinary Tract: Both adrenal glands appear normal. There are numerous small nonobstructing renal calculi bilaterally. No evidence of ureteral calculus or hydronephrosis. There is asymmetric perinephric soft tissue stranding around the lower pole of the right kidney. There is mildly heterogeneous enhancement in this region, suspicious for pyelonephritis. There is no focal fluid collection. The bladder appears normal. Stomach/Bowel: No evidence of bowel wall thickening, distention or surrounding inflammatory change. The appendix is not conclusively identified, although there is no pericecal inflammation. There is prominent stool throughout the colon. Vascular/Lymphatic: There are no enlarged abdominal or pelvic lymph nodes. No significant vascular findings. Reproductive: Bilateral tubal ligation clips. The uterus and adnexa appear unremarkable. Other: No ascites, free air or focal extraluminal fluid collection. Musculoskeletal: No acute or significant osseous findings. 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 IMPRESSION: 1. Asymmetric perinephric soft tissue stranding and heterogeneous enhancement in the lower pole of the right kidney, most consistent with pyelonephritis. Correlation with urine analysis recommended. 2. Nonobstructing bilateral nephrolithiasis. No evidence of ureteral calculus or hydronephrosis. 3. Possible mild periportal edema in the liver. This is not definite and could be related to aggressive hydration. 4. Prominent stool throughout the colon. Electronically Signed   By: Carey Bullocks M.D.   On: 11/17/2018 13:49    Procedures Procedures (including critical care time)  Medications Ordered in ED Medications  cefTRIAXone (ROCEPHIN) 1 g in sodium chloride 0.9 % 100 mL IVPB (1 g Intravenous New Bag/Given 11/17/18 1526)   acetaminophen (TYLENOL) tablet 650 mg (650 mg Oral Given 11/17/18 1010)  ketorolac (TORADOL) 30 MG/ML injection 30 mg (30 mg Intravenous Given 11/17/18 1054)  ondansetron (ZOFRAN) injection 4 mg (4 mg Intravenous Given 11/17/18 1053)  sodium chloride 0.9 % bolus 1,000 mL (0 mLs Intravenous Stopped 11/17/18 1125)    Followed by  sodium chloride 0.9 % bolus 1,000 mL (  0 mLs Intravenous Stopped 11/17/18 1241)  potassium chloride SA (K-DUR,KLOR-CON) CR tablet 40 mEq (40 mEq Oral Given 11/17/18 1312)  iohexol (OMNIPAQUE) 300 MG/ML solution 100 mL (100 mLs Intravenous Contrast Given 11/17/18 1232)     Initial Impression / Assessment and Plan / ED Course  I have reviewed the triage vital signs and the nursing notes.  Pertinent labs & imaging results that were available during my care of the patient were reviewed by me and considered in my medical decision making (see chart for details).  Clinical Course as of Nov 17 1537  Sun Nov 17, 2018  1155 Patient states she feels much better after the Toradol, Tylenol, and some IV fluids. Smiling and laughing.    [SJ]  1525 Prior to discharge, I noted the patient's pulse rate to be about 96.  Pulse Rate(!): 110 [SJ]    Clinical Course User Index [SJ] Shaylynn Nulty C, PA-C   Patient presents with fever, body aches, and right flank pain. She is initially uncomfortable (but nontoxic) appearing, febrile, and tachycardic, however, with fever management and IV fluids, patient improved significantly.  No hypotension or lactic acidosis. She does have a leukocytosis.  Evidence of pyelonephritis on CT, supported by UA findings.  Some hypokalemia was noted and addressed. Discussed discharge versus admission.  Patient stated she felt much better and would like to go home. The patient was given instructions for home care as well as strict return precautions. Patient voices understanding of these instructions, accepts the plan, and is comfortable with discharge.  Vitals:    11/17/18 1007 11/17/18 1051 11/17/18 1307 11/17/18 1519  BP: 103/72  100/66 118/78  Pulse: (!) 130  97 (!) 110  Resp: 20  14 17   Temp: (!) 103.2 F (39.6 C)  97.8 F (36.6 C) 97.9 F (36.6 C)  TempSrc:   Oral Oral  SpO2: 96%  98% 98%  Weight:  51.3 kg    Height:  5\' 1"  (1.549 m)       Final Clinical Impressions(s) / ED Diagnoses   Final diagnoses:  Hypokalemia  Pyelonephritis    ED Discharge Orders         Ordered    cephALEXin (KEFLEX) 500 MG capsule  4 times daily     11/17/18 1428    ondansetron (ZOFRAN ODT) 4 MG disintegrating tablet  Every 8 hours PRN     11/17/18 1428           Anselm Pancoast, PA-C 11/17/18 1539    Tegeler, Canary Brim, MD 11/17/18 [id number]

## 2018-11-18 ENCOUNTER — Other Ambulatory Visit: Payer: Self-pay

## 2018-11-18 ENCOUNTER — Emergency Department (HOSPITAL_COMMUNITY)
Admission: EM | Admit: 2018-11-18 | Discharge: 2018-11-18 | Disposition: A | Discharge status: Home / Self Care | Payer: Self-pay | Attending: Emergency Medicine | Admitting: Emergency Medicine

## 2018-11-18 ENCOUNTER — Encounter (HOSPITAL_COMMUNITY): Payer: Self-pay

## 2018-11-18 DIAGNOSIS — N12 Tubulo-interstitial nephritis, not specified as acute or chronic: Secondary | ICD-10-CM

## 2018-11-18 DIAGNOSIS — R112 Nausea with vomiting, unspecified: Secondary | ICD-10-CM

## 2018-11-18 DIAGNOSIS — N10 Acute tubulo-interstitial nephritis: Secondary | ICD-10-CM | POA: Insufficient documentation

## 2018-11-18 LAB — CBC WITH DIFFERENTIAL/PLATELET
Abs Immature Granulocytes: 0.12 10*3/uL — ABNORMAL HIGH (ref 0.00–0.07)
Basophils Absolute: 0 10*3/uL (ref 0.0–0.1)
Basophils Relative: 0 %
Eosinophils Absolute: 0 10*3/uL (ref 0.0–0.5)
Eosinophils Relative: 0 %
HCT: 32.1 % — ABNORMAL LOW (ref 36.0–46.0)
Hemoglobin: 10.4 g/dL — ABNORMAL LOW (ref 12.0–15.0)
Immature Granulocytes: 1 %
Lymphocytes Relative: 6 %
Lymphs Abs: 0.8 10*3/uL (ref 0.7–4.0)
MCH: 30.7 pg (ref 26.0–34.0)
MCHC: 32.4 g/dL (ref 30.0–36.0)
MCV: 94.7 fL (ref 80.0–100.0)
Monocytes Absolute: 0.9 10*3/uL (ref 0.1–1.0)
Monocytes Relative: 6 %
NEUTROS ABS: 12.1 10*3/uL — AB (ref 1.7–7.7)
Neutrophils Relative %: 87 %
Platelets: 97 10*3/uL — ABNORMAL LOW (ref 150–400)
RBC: 3.39 MIL/uL — ABNORMAL LOW (ref 3.87–5.11)
RDW: 14.1 % (ref 11.5–15.5)
WBC: 13.9 10*3/uL — AB (ref 4.0–10.5)
nRBC: 0 % (ref 0.0–0.2)

## 2018-11-18 LAB — URINALYSIS, ROUTINE W REFLEX MICROSCOPIC
Bilirubin Urine: NEGATIVE
Glucose, UA: NEGATIVE mg/dL
Ketones, ur: 20 mg/dL — AB
Nitrite: NEGATIVE
Protein, ur: 30 mg/dL — AB
Specific Gravity, Urine: 1.012 (ref 1.005–1.030)
WBC, UA: >50 WBC/hpf — ABNORMAL HIGH (ref 0–5)
pH: 6 (ref 5.0–8.0)

## 2018-11-18 LAB — COMPREHENSIVE METABOLIC PANEL
ALT: 25 U/L (ref 0–44)
AST: 26 U/L (ref 15–41)
Albumin: 3.5 g/dL (ref 3.5–5.0)
Alkaline Phosphatase: 50 U/L (ref 38–126)
Anion gap: 10 (ref 5–15)
BUN: 10 mg/dL (ref 6–20)
CO2: 20 mmol/L — ABNORMAL LOW (ref 22–32)
Calcium: 8.4 mg/dL — ABNORMAL LOW (ref 8.9–10.3)
Chloride: 104 mmol/L (ref 98–111)
Creatinine, Ser: 0.84 mg/dL (ref 0.44–1.00)
GFR calc non Af Amer: >60 mL/min (ref >60)
Glucose, Bld: 117 mg/dL — ABNORMAL HIGH (ref 70–99)
Potassium: 3.3 mmol/L — ABNORMAL LOW (ref 3.5–5.1)
SODIUM: 134 mmol/L — AB (ref 135–145)
Total Bilirubin: 0.8 mg/dL (ref 0.3–1.2)
Total Protein: 7 g/dL (ref 6.5–8.1)

## 2018-11-18 LAB — LACTIC ACID, PLASMA: Lactic Acid, Venous: 0.9 mmol/L (ref 0.5–1.9)

## 2018-11-18 MED ORDER — SODIUM CHLORIDE 0.9 % IV BOLUS
1000.0000 mL | Freq: Once | INTRAVENOUS | Status: AC
  Administered 2018-11-18: 1000 mL via INTRAVENOUS

## 2018-11-18 MED ORDER — KETOROLAC TROMETHAMINE 30 MG/ML IJ SOLN
30.0000 mg | Freq: Once | INTRAMUSCULAR | Status: AC
  Administered 2018-11-18: 30 mg via INTRAVENOUS
  Filled 2018-11-18: qty 1

## 2018-11-18 MED ORDER — SODIUM CHLORIDE 0.9 % IV SOLN
1.0000 g | Freq: Once | INTRAVENOUS | Status: AC
  Administered 2018-11-18: 1 g via INTRAVENOUS
  Filled 2018-11-18: qty 10

## 2018-11-18 MED ORDER — ONDANSETRON 4 MG PO TBDP: 4.0000 mg | ORAL_TABLET | Freq: Three times a day (TID) | ORAL | 0 refills | Status: DC | PRN

## 2018-11-18 NOTE — ED Provider Notes (Signed)
Emergency Department Provider Note   I have reviewed the triage vital signs and the nursing notes.   HISTORY  Chief Complaint Flank Pain   HPI Ann Santiago is a 28 y.o. female with PMH of anemia returns to the emergency department for worsening flank pain, fevers, chills, and new onset vomiting.  Patient was seen in the emergency department yesterday and diagnosed with pyelonephritis.  She had lab work, Rocephin, and IV fluids.  Patient states she was feeling somewhat better at the time of discharge but symptoms have worsened overnight.  She is having worsening pain on the right and one episode of vomiting last night after taking her antibiotics.  She was discharged home with Keflex.  She denies any cough, congestion symptoms.  No longer having dysuria.   Past Medical History:  Diagnosis Date  . Anemia   . Headache   . History of chlamydia   . Preterm labor 02/01/2017  . Vaginal Pap smear, abnormal     Patient Active Problem List   Diagnosis Date Noted  . Normal vaginal delivery 02/04/2017  . Preterm labor 02/01/2017  . Normal labor 02/01/2017  . Late prenatal care, antepartum 11/22/2016    Past Surgical History:  Procedure Laterality Date  . TUBAL LIGATION Bilateral 02/02/2017   Procedure: POST PARTUM TUBAL LIGATION;  Surgeon: Chidester Bing, MD;  Location: [facility] BIRTHING SUITES;  Service: Gynecology;  Laterality: Bilateral;  . WISDOM TOOTH EXTRACTION     Allergies Patient has no known allergies.  Family History  Problem Relation Age of Onset  . Hypertension Maternal Grandmother     Social History Social History   Tobacco Use  . Smoking status: Never Smoker  . Smokeless tobacco: Never Used  Substance Use Topics  . Alcohol use: No    Comment: social  . Drug use: No    Review of Systems  Constitutional: Positive fever/chills and body aches.  Eyes: No visual changes. ENT: No sore throat. Cardiovascular: Denies chest pain. Respiratory: Denies shortness of  breath. Gastrointestinal: Positive right flank/abdominal pain. Positive nausea and vomiting.  No diarrhea.  No constipation. Genitourinary: Negative for dysuria. Musculoskeletal: Positive right lower back.  Skin: Negative for rash. Neurological: Negative for focal weakness or numbness. Positive HA.   10-point ROS otherwise negative.  ____________________________________________   PHYSICAL EXAM:  VITAL SIGNS: ED Triage Vitals  Enc Vitals Group     BP 11/18/18 1106 114/71     Pulse Rate 11/18/18 1106 (!) 104     Resp 11/18/18 1106 16     Temp 11/18/18 1106 99.3 F (37.4 C)     Temp Source 11/18/18 1106 Oral     SpO2 11/18/18 1106 99 %     Weight 11/18/18 1107 113 lb (51.3 kg)     Height 11/18/18 1107 5\' 1"  (1.549 m)     Pain Score 11/18/18 1114 10   Constitutional: Alert and oriented. Well appearing and in no acute distress. Eyes: Conjunctivae are normal.  Head: Atraumatic. Nose: No congestion/rhinnorhea. Mouth/Throat: Mucous membranes are dry.  Neck: No stridor.   Cardiovascular: Tachycardia. Good peripheral circulation. Grossly normal heart sounds.   Respiratory: Normal respiratory effort.  No retractions. Lungs CTAB. Gastrointestinal: Soft and nontender. No distention.  Musculoskeletal: No lower extremity tenderness nor edema. No gross deformities of extremities. Neurologic:  Normal speech and language. No gross focal neurologic deficits are appreciated.  Skin:  Skin is warm, dry and intact. No rash noted.  ____________________________________________   LABS (all labs ordered are  listed, but only abnormal results are displayed)  Labs Reviewed  COMPREHENSIVE METABOLIC PANEL - Abnormal; Notable for the following components:      Result Value   Sodium 134 (*)    Potassium 3.3 (*)    CO2 20 (*)    Glucose, Bld 117 (*)    Calcium 8.4 (*)    All other components within normal limits  CBC WITH DIFFERENTIAL/PLATELET - Abnormal; Notable for the following components:    WBC 13.9 (*)    RBC 3.39 (*)    Hemoglobin 10.4 (*)    HCT 32.1 (*)    Platelets 97 (*)    Neutro Abs 12.1 (*)    Abs Immature Granulocytes 0.12 (*)    All other components within normal limits  URINALYSIS, ROUTINE W REFLEX MICROSCOPIC - Abnormal; Notable for the following components:   APPearance HAZY (*)    Hgb urine dipstick LARGE (*)    Ketones, ur 20 (*)    Protein, ur 30 (*)    Leukocytes,Ua SMALL (*)    WBC, UA >50 (*)    Bacteria, UA RARE (*)    All other components within normal limits  CULTURE, BLOOD (ROUTINE X 2)  CULTURE, BLOOD (ROUTINE X 2)  LACTIC ACID, PLASMA   ____________________________________________  RADIOLOGY  None. CT from yesterday reviewed.  ____________________________________________   PROCEDURES  Procedure(s) performed:   Procedures  None  ____________________________________________   INITIAL IMPRESSION / ASSESSMENT AND PLAN / ED COURSE  Pertinent labs & imaging results that were available during my care of the patient were reviewed by me and considered in my medical decision making (see chart for details).  Patient returns to the emergency department for evaluation of worsening symptoms at home with new onset vomiting.  She appears dehydrated and fatigued.  She has tachycardia here but afebrile.  Patient found to have pyelonephritis yesterday and discharged home with Keflex but does not seem to be tolerating well at home.  Plan for repeat labs, IV fluids, Rocephin.   01:45 PM Lab work reviewed.  Lactate is normal.  UA slightly improved.  WBC count is downtrending.  Patient vital signs have normalized here.  She reports feeling much better.  I advised observational admission given the patient's return to the emergency department and diagnosis.  She states that she is feeling much better and would like to return home. She will fill the Zofran prescription.  Only one episode of vomiting but is otherwise tolerating her oral medications.  The  dose and duration seem appropriate as prescribed yesterday.  I will send blood cultures and additional urine culture today.  Discussed that she will need to return to the emergency department immediately if she worsens and at that time would likely be admitted if she fails outpatient treatment. Patient is tolerating PO in the ED.  ____________________________________________  FINAL CLINICAL IMPRESSION(S) / ED DIAGNOSES  Final diagnoses:  Pyelonephritis  Non-intractable vomiting with nausea, unspecified vomiting type     MEDICATIONS GIVEN DURING THIS VISIT:  Medications  sodium chloride 0.9 % bolus 1,000 mL (0 mLs Intravenous Stopped 11/18/18 1306)  ketorolac (TORADOL) 30 MG/ML injection 30 mg (30 mg Intravenous Given 11/18/18 1222)  cefTRIAXone (ROCEPHIN) 1 g in sodium chloride 0.9 % 100 mL IVPB (0 g Intravenous Stopped 11/18/18 1306)     Note:  This document was prepared using Dragon voice recognition software and may include unintentional dictation errors.  Alona Bene, MD Emergency Medicine    Long, Arlyss Repress, MD 11/19/18  0936  

## 2018-11-18 NOTE — ED Triage Notes (Signed)
Pt reports generalized body aches, chills, nausea, headache. She reports diagnosed with kidney infection yesterday and is taking abx.

## 2018-11-18 NOTE — Discharge Instructions (Signed)

## 2018-11-18 NOTE — ED Notes (Signed)
ED Provider at bedside. 

## 2018-11-19 LAB — URINE CULTURE

## 2018-11-20 ENCOUNTER — Telehealth (HOSPITAL_COMMUNITY): Payer: Self-pay | Admitting: Pharmacist

## 2018-11-20 ENCOUNTER — Telehealth: Payer: Self-pay | Admitting: Emergency Medicine

## 2018-11-20 NOTE — Progress Notes (Signed)
ED Antimicrobial Stewardship Positive Culture Follow Up   Ann Santiago is an 28 y.o. female who presented to [facility] on (Not on file) with a chief complaint of No chief complaint on file.   Recent Results (from the past 720 hour(s))  Urine culture     Status: Abnormal   Collection Time: 11/17/18  2:23 PM  Result Value Ref Range Status   Specimen Description URINE, RANDOM  Final   Special Requests   Final    NONE Performed at [facility], [address], [place], [place] [address]    Culture (A)  Final    >=100,000 COLONIES/mL ESCHERICHIA COLI Confirmed Extended Spectrum Beta-Lactamase Producer (ESBL).  In bloodstream infections from ESBL organisms, carbapenems are preferred over piperacillin/tazobactam. They are shown to have a lower risk of mortality.    Report Status 11/19/2018 FINAL  Final   Organism ID, Bacteria ESCHERICHIA COLI (A)  Final      Susceptibility   Escherichia coli - MIC*    AMPICILLIN >=32 RESISTANT Resistant     CEFAZOLIN >=64 RESISTANT Resistant     CEFTRIAXONE >=64 RESISTANT Resistant     CIPROFLOXACIN >=4 RESISTANT Resistant     GENTAMICIN <=1 SENSITIVE Sensitive     IMIPENEM <=0.25 SENSITIVE Sensitive     NITROFURANTOIN 32 SENSITIVE Sensitive     TRIMETH/SULFA >=320 RESISTANT Resistant     AMPICILLIN/SULBACTAM 4 SENSITIVE Sensitive     PIP/TAZO <=4 SENSITIVE Sensitive     Extended ESBL POSITIVE Resistant     * >=100,000 COLONIES/mL ESCHERICHIA COLI  Culture, blood (routine x 2)     Status: None (Preliminary result)   Collection Time: 11/18/18 12:17 PM  Result Value Ref Range Status   Specimen Description BLOOD RIGHT ANTECUBITAL  Final   Special Requests   Final    BOTTLES DRAWN AEROBIC AND ANAEROBIC Blood Culture adequate volume   Culture   Final    NO GROWTH < 24 HOURS Performed at [facility], [address] [place], [place] [address]    Report Status PENDING  Incomplete  Culture, blood (routine x 2)     Status: None  (Preliminary result)   Collection Time: 11/18/18  2:01 PM  Result Value Ref Range Status   Specimen Description BLOOD LEFT WRIST  Final   Special Requests   Final    BOTTLES DRAWN AEROBIC AND ANAEROBIC Blood Culture results may not be optimal due to an inadequate volume of blood received in culture bottles   Culture   Final    NO GROWTH < 24 HOURS Performed at [facility], [address] [place], [place] [address]    Report Status PENDING  Incomplete    [x]  Treated with Cephalexin (Keflex), organism resistant to prescribed antimicrobial []  Patient discharged originally without antimicrobial agent and treatment is now indicated  New antibiotic prescription: Nitrofurantoin   ED Provider: Nitrofurantoin 100 mg po BID x 5 days   Baldemar Friday 11/20/2018, 8:08 AM Clinical Pharmacist Monday - Friday phone -  [phone] Saturday - Sunday phone - [phone]

## 2018-11-20 NOTE — Telephone Encounter (Signed)
Post ED Visit - Positive Culture Follow-up: Successful Patient Follow-Up  Culture assessed and recommendations reviewed by:  []  Enzo Bi, Pharm.D. []  Celedonio Miyamoto, Pharm.D., BCPS AQ-ID []  Garvin Fila, Pharm.D., BCPS []  Georgina Pillion, Pharm.D., BCPS []  [place], [address].D., BCPS, AAHIVP []  Estella Husk, Pharm.D., BCPS, AAHIVP []  Lysle Pearl, PharmD, BCPS []  Phillips Climes, PharmD, BCPS [x]  Agapito Games, PharmD, BCPS []  Verlan Friends, PharmD  Positive urine culture  []  Patient discharged without antimicrobial prescription and treatment is now indicated [x]  Organism is resistant to prescribed ED discharge antimicrobial []  Patient with positive blood cultures  Changes discussed with ED provider: Burna Forts PA New antibiotic prescription Stop Keflex, start Macrobid 100mg  po bid x 5 days  Attempting to contact patient   Berle Mull 11/20/2018, 10:39 AM

## 2018-11-23 LAB — CULTURE, BLOOD (ROUTINE X 2)
Culture: NO GROWTH
Culture: NO GROWTH
Special Requests: ADEQUATE

## 2018-11-29 ENCOUNTER — Telehealth: Payer: Self-pay | Admitting: *Deleted

## 2018-11-29 NOTE — Telephone Encounter (Signed)
Post ED Visit - Positive Culture Follow-up: Successful Patient Follow-Up  Culture assessed and recommendations reviewed by:  []  Enzo Bi, Pharm.D. []  Celedonio Miyamoto, Pharm.D., BCPS AQ-ID []  Garvin Fila, Pharm.D., BCPS []  Georgina Pillion, [address].D., BCPS []  [place], [address].D., BCPS, AAHIVP []  Estella Husk, Pharm.D., BCPS, AAHIVP []  Lysle Pearl, PharmD, BCPS []  Phillips Climes, PharmD, BCPS [x]  Agapito Games, PharmD, BCPS []  Verlan Friends, PharmD  Positive urine culture  []  Patient discharged without antimicrobial prescription and treatment is now indicated [x]  Organism is resistant to prescribed ED discharge antimicrobial []  Patient with positive blood cultures  Changes discussed with ED provider Eyvonne Mechanic, PA New antibiotic prescription Macrobid 100mg  PO BID x 5 days Called to [address], [employer]. [phone]  Contacted by pt in response to letter sent to address on fiile, 11/29/2018. 1715   Lysle Pearl 11/29/2018, 5:14 PM

## 2019-01-17 ENCOUNTER — Telehealth: Payer: Self-pay | Admitting: Nurse Practitioner

## 2019-01-17 DIAGNOSIS — R059 Cough, unspecified: Secondary | ICD-10-CM

## 2019-01-17 DIAGNOSIS — R05 Cough: Secondary | ICD-10-CM

## 2019-01-17 MED ORDER — BENZONATATE 100 MG PO CAPS: 100.0000 mg | ORAL_CAPSULE | Freq: Three times a day (TID) | ORAL | 0 refills | Status: DC | PRN

## 2019-01-17 NOTE — Progress Notes (Signed)

## 2019-02-19 ENCOUNTER — Telehealth: Payer: Self-pay | Admitting: Family

## 2019-02-19 DIAGNOSIS — R109 Unspecified abdominal pain: Secondary | ICD-10-CM

## 2019-02-19 DIAGNOSIS — R509 Fever, unspecified: Secondary | ICD-10-CM

## 2019-02-19 NOTE — Progress Notes (Signed)
Based on what you shared with me, I feel your condition warrants further evaluation and I recommend that you be seen for a face to face office visit.  Given your symptoms of flank pain and infection you need to be seen face to face.   NOTE: If you entered your credit card information for this eVisit, you will not be charged. You may see a "hold" on your card for the $35 but that hold will drop off and you will not have a charge processed.  If you are having a true medical emergency please call 911.  If you need an urgent face to face visit, [place] has four urgent care centers for your convenience.    PLEASE NOTE: THE [facility] LOCATIONS AND URGENT CARE CLINICS DO NOT HAVE THE TESTING FOR CORONAVIRUS COVID19 AVAILABLE.  IF YOU FEEL YOU NEED THIS TEST YOU MUST GO TO A TRIAGE LOCATION AT ONE OF THE HOSPITAL EMERGENCY DEPARTMENTS   [email or web address] to reserve your spot online an avoid wait times  [facility] [address] [place], [place] [address] Modified hours of operation: Monday-Friday, 12 PM to 6 PM  Saturday & Sunday 10 AM to 4 PM *Across the street from [employer]  [employer] (New Address!) [address], [address] [place], [place] [address] *Just off [employer], across the road from [employer]* Modified hours of operation: Monday-Friday, 12 PM to 6 PM  Closed Saturday & Sunday  [facility]'s modified hours of operation will be in effect from May 1 until May 31   The following sites will take your insurance:  . [facility]  [phone] Get Driving Directions Find a Provider at this Location  [address] [place], [place] [address] . 10 am to 8 pm Monday-Friday . 12 pm to 8 pm Saturday-Sunday   . [facility] at [facility]  [phone] Get Driving Directions Find a Provider at this Location  [address] [place], [place] [address] . 8 am to 8 pm  Monday-Friday . 9 am to 6 pm Saturday . 11 am to 6 pm Sunday   . [facility] at [facility]  [phone] Get Driving Directions  [address] [place], [place] [address] . 8 am to 8 pm Monday-Friday . 8 am to 4 pm Saturday-Sunday   Your e-visit answers were reviewed by a board certified advanced clinical practitioner to complete your personal care plan.  Thank you for using e-Visits.

## 2019-03-05 ENCOUNTER — Encounter (HOSPITAL_COMMUNITY): Payer: Self-pay | Admitting: Emergency Medicine

## 2019-03-05 ENCOUNTER — Emergency Department (HOSPITAL_COMMUNITY)
Admission: EM | Admit: 2019-03-05 | Discharge: 2019-03-05 | Disposition: A | Discharge status: Home / Self Care | Payer: Self-pay | Attending: Emergency Medicine | Admitting: Emergency Medicine

## 2019-03-05 ENCOUNTER — Emergency Department (HOSPITAL_COMMUNITY): Payer: Self-pay

## 2019-03-05 ENCOUNTER — Other Ambulatory Visit: Payer: Self-pay

## 2019-03-05 DIAGNOSIS — N200 Calculus of kidney: Secondary | ICD-10-CM | POA: Insufficient documentation

## 2019-03-05 LAB — CBC WITH DIFFERENTIAL/PLATELET
Abs Immature Granulocytes: 0.01 10*3/uL (ref 0.00–0.07)
Basophils Absolute: 0 10*3/uL (ref 0.0–0.1)
Basophils Relative: 0 %
Eosinophils Absolute: 0.4 10*3/uL (ref 0.0–0.5)
Eosinophils Relative: 7 %
HCT: 35.5 % — ABNORMAL LOW (ref 36.0–46.0)
Hemoglobin: 11.6 g/dL — ABNORMAL LOW (ref 12.0–15.0)
Immature Granulocytes: 0 %
Lymphocytes Relative: 26 %
Lymphs Abs: 1.3 10*3/uL (ref 0.7–4.0)
MCH: 31 pg (ref 26.0–34.0)
MCHC: 32.7 g/dL (ref 30.0–36.0)
MCV: 94.9 fL (ref 80.0–100.0)
Monocytes Absolute: 0.3 10*3/uL (ref 0.1–1.0)
Monocytes Relative: 6 %
Neutro Abs: 3.1 10*3/uL (ref 1.7–7.7)
Neutrophils Relative %: 61 %
Platelets: 239 10*3/uL (ref 150–400)
RBC: 3.74 MIL/uL — ABNORMAL LOW (ref 3.87–5.11)
RDW: 13.2 % (ref 11.5–15.5)
WBC: 5.1 10*3/uL (ref 4.0–10.5)
nRBC: 0 % (ref 0.0–0.2)

## 2019-03-05 LAB — BASIC METABOLIC PANEL
Anion gap: 6 (ref 5–15)
BUN: 9 mg/dL (ref 6–20)
CO2: 23 mmol/L (ref 22–32)
Calcium: 8.6 mg/dL — ABNORMAL LOW (ref 8.9–10.3)
Chloride: 110 mmol/L (ref 98–111)
Creatinine, Ser: 0.56 mg/dL (ref 0.44–1.00)
GFR calc Af Amer: >60 mL/min (ref >60)
GFR calc non Af Amer: >60 mL/min (ref >60)
Glucose, Bld: 78 mg/dL (ref 70–99)
Potassium: 3.6 mmol/L (ref 3.5–5.1)
Sodium: 139 mmol/L (ref 135–145)

## 2019-03-05 LAB — I-STAT BETA HCG BLOOD, ED (MC, WL, AP ONLY): I-stat hCG, quantitative: <5 m[IU]/mL (ref <5)

## 2019-03-05 MED ORDER — KETOROLAC TROMETHAMINE 15 MG/ML IJ SOLN
15.0000 mg | Freq: Once | INTRAMUSCULAR | Status: AC
  Administered 2019-03-05: 15 mg via INTRAVENOUS
  Filled 2019-03-05: qty 1

## 2019-03-05 MED ORDER — HYDROCODONE-ACETAMINOPHEN 5-325 MG PO TABS
1.0000 | ORAL_TABLET | Freq: Once | ORAL | Status: AC
  Administered 2019-03-05: 1 via ORAL
  Filled 2019-03-05: qty 1

## 2019-03-05 NOTE — ED Notes (Signed)
Pt states that she is unable to provide urine sample at this time.  

## 2019-03-05 NOTE — Discharge Instructions (Addendum)
We saw you in the ER for the abdominal pain. Our results indicate that you have a kidney stone. We were able to get your pain is relative control, and we can safely send you home.  Continue taking the pain medicine prescribed and follow up with the Urologist.

## 2019-03-05 NOTE — ED Triage Notes (Addendum)
Patient reports recent diagnosis of bilateral kidney stones with surgery scheduled for Friday. C/o bilateral flank and back pain.  Reports decreased urination x2 days. Denies N/V.  20g L AC Fentanyl with EMS

## 2019-03-06 NOTE — ED Provider Notes (Signed)
[facility]-EMERGENCY DEPT Provider Note   CSN: [id number] Arrival date & time: 03/05/19  1110    History   Chief Complaint Chief Complaint  Patient presents with  . Flank Pain    HPI Ann Santiago is a 28 y.o. female.     HPI 28 year old female comes in with chief complaint of back pain and flank pain.  Patient has known history of bilateral nephrolithiasis and is being treated for UTI with Macrobid by her urologist.  She is scheduled for ureteral stent later this week.  She reports that earlier today she suddenly started having worsening pain in the flank region.  She had associated nausea and because the pain was severe she called EMS.  Pain is similar to her kidney stone pain.  She was given IV fentanyl in route and is feeling better now.  She denies any fevers or chills.  Past Medical History:  Diagnosis Date  . Anemia   . Headache   . History of chlamydia   . Preterm labor 02/01/2017  . Vaginal Pap smear, abnormal     Patient Active Problem List   Diagnosis Date Noted  . Normal vaginal delivery 02/04/2017  . Preterm labor 02/01/2017  . Normal labor 02/01/2017  . Late prenatal care, antepartum 11/22/2016    Past Surgical History:  Procedure Laterality Date  . TUBAL LIGATION Bilateral 02/02/2017   Procedure: POST PARTUM TUBAL LIGATION;  Surgeon: Trujillo Alto Bing, MD;  Location: [facility] BIRTHING SUITES;  Service: Gynecology;  Laterality: Bilateral;  . WISDOM TOOTH EXTRACTION       OB History    Gravida  4   Para  4   Term  3   Preterm  1   AB      Living  4     SAB      TAB      Ectopic      Multiple  0   Live Births  4            Home Medications    Prior to Admission medications   Medication Sig Start Date End Date Taking? Authorizing Provider  cefdinir (OMNICEF) 300 MG capsule Take 300 mg by mouth 2 (two) times a day. 02/28/19  Yes [provider]  benzonatate (TESSALON PERLES) 100 MG capsule Take 1 capsule  (100 mg total) by mouth 3 (three) times daily as needed. Patient not taking: Reported on 03/05/2019 01/17/19   Bennie Pierini, FNP  cetirizine-pseudoephedrine (ZYRTEC-D) 5-120 MG tablet Take 1 tablet by mouth daily. Patient not taking: Reported on 03/05/2019 07/24/17   Belinda Fisher, PA-C  fluticasone [facility]) 50 MCG/ACT nasal spray Place 2 sprays into both nostrils daily. Patient not taking: Reported on 03/05/2019 07/24/17   Belinda Fisher, PA-C  HYDROcodone-acetaminophen (NORCO/VICODIN) 5-325 MG tablet Take 1 tablet by mouth every 4 (four) hours as needed for pain. 03/03/19   [provider]  metroNIDAZOLE (FLAGYL) 500 MG tablet Take 1 tablet (500 mg total) by mouth 2 (two) times daily. Patient not taking: Reported on 03/05/2019 10/03/18   Frederica Kuster, MD  ondansetron (ZOFRAN ODT) 4 MG disintegrating tablet Take 1 tablet (4 mg total) by mouth every 8 (eight) hours as needed for nausea or vomiting. Patient not taking: Reported on 03/05/2019 11/18/18   Long, Arlyss Repress, MD  rizatriptan (MAXALT) 5 MG tablet Take 1 tablet (5 mg total) by mouth as needed for migraine. May repeat in 2 hours if needed Patient not taking: Reported  on 03/05/2019 10/03/18   Frederica Kuster, MD    Family History Family History  Problem Relation Age of Onset  . Hypertension Maternal Grandmother     Social History Social History   Tobacco Use  . Smoking status: Never Smoker  . Smokeless tobacco: Never Used  Substance Use Topics  . Alcohol use: No    Comment: social  . Drug use: No     Allergies   Patient has no known allergies.   Review of Systems Review of Systems  Constitutional: Positive for activity change.  Respiratory: Negative for shortness of breath.   Cardiovascular: Negative for chest pain.  Gastrointestinal: Positive for nausea.  Genitourinary: Positive for flank pain.  All other systems reviewed and are negative.    Physical Exam Updated Vital Signs BP 122/78   Pulse 63   Temp 98.4 F  (36.9 C) (Oral)   Resp 18   SpO2 97%   Physical Exam Vitals signs and nursing note reviewed.  Constitutional:      Appearance: She is well-developed.  HENT:     Head: Normocephalic and atraumatic.  Neck:     Musculoskeletal: Normal range of motion and neck supple.  Cardiovascular:     Rate and Rhythm: Normal rate.  Pulmonary:     Effort: Pulmonary effort is normal.  Abdominal:     General: Bowel sounds are normal.     Tenderness: There is no abdominal tenderness.  Skin:    General: Skin is warm and dry.  Neurological:     Mental Status: She is alert and oriented to person, place, and time.      ED Treatments / Results  Labs (all labs ordered are listed, but only abnormal results are displayed) Labs Reviewed  BASIC METABOLIC PANEL - Abnormal; Notable for the following components:      Result Value   Calcium 8.6 (*)    All other components within normal limits  CBC WITH DIFFERENTIAL/PLATELET - Abnormal; Notable for the following components:   RBC 3.74 (*)    Hemoglobin 11.6 (*)    HCT 35.5 (*)    All other components within normal limits  I-STAT BETA HCG BLOOD, ED ([facility], [facility], [facility] ONLY)    EKG None  Radiology Ct Renal Stone Study  Result Date: 03/05/2019 CLINICAL DATA:  Bilateral flank pain EXAM: CT ABDOMEN AND PELVIS WITHOUT CONTRAST TECHNIQUE: Multidetector CT imaging of the abdomen and pelvis was performed following the standard protocol without IV contrast. COMPARISON:  02/28/2019, CT from 11/17/2018 FINDINGS: Lower chest: No acute abnormality. Hepatobiliary: No focal liver abnormality is seen. No gallstones, gallbladder wall thickening, or biliary dilatation. Pancreas: Unremarkable. No pancreatic ductal dilatation or surrounding inflammatory changes. Spleen: Normal in size without focal abnormality. Adrenals/Urinary Tract: Adrenal glands are within normal limits. Kidneys demonstrate multiple small bilateral renal calculi similar to that seen on prior exam. No  obstructive changes are seen. Bladder is well distended. Stomach/Bowel: The appendix is within normal limits. The large and small bowel are unremarkable. Stomach is within normal limits. Vascular/Lymphatic: No significant vascular findings are present. No enlarged abdominal or pelvic lymph nodes. Reproductive: Uterus and bilateral adnexa are unremarkable. Changes of tubal ligation are noted. Other: No abdominal wall hernia or abnormality. No abdominopelvic ascites. Musculoskeletal: No acute or significant osseous findings. IMPRESSION: Bilateral small renal calculi. The largest of these measures approximately 2 mm bilaterally. No obstructive changes are noted. Electronically Signed   By: Alcide Clever M.D.   On: 03/05/2019 15:09  Procedures Procedures (including critical care time)  Medications Ordered in ED Medications  HYDROcodone-acetaminophen (NORCO/VICODIN) 5-325 MG per tablet 1 tablet (1 tablet Oral Given 03/05/19 1333)  ketorolac (TORADOL) 15 MG/ML injection 15 mg (15 mg Intravenous Given 03/05/19 1559)     Initial Impression / Assessment and Plan / ED Course  I have reviewed the triage vital signs and the nursing notes.  Pertinent labs & imaging results that were available during my care of the patient were reviewed by me and considered in my medical decision making (see chart for details).        28 year old comes in a chief complaint of back pain and flank pain.  She has known history of kidney stones and is on Macrobid for UTI.  She is supposed to get stents placed this Friday.  Earlier today she is driving sudden onset worsening of her pain.  The pain appears to be colicky nature and consistent with her kidney stone pains.  She was given IV fentanyl in route to the ER and is feeling better.  We ordered basic labs, and monitor patient for short while.  During our evaluation it was noted that she did not have any signs of sepsis.  CT scan does not reveal any evidence of severe  hydronephrosis and labs do not show renal failure, therefore we will advise proceeding with the scheduled procedure on Friday.  Patient's pain was controlled prior to discharge.  Final Clinical Impressions(s) / ED Diagnoses   Final diagnoses:  Nephrolithiasis    ED Discharge Orders    None       Derwood KaplanNanavati, Cavion Faiola, MD 03/06/19 [phone]

## 2019-06-11 ENCOUNTER — Inpatient Hospital Stay: Admission: RE | Admit: 2019-06-11 | Payer: Self-pay | Source: Ambulatory Visit

## 2019-06-11 ENCOUNTER — Ambulatory Visit (INDEPENDENT_AMBULATORY_CARE_PROVIDER_SITE_OTHER)
Admission: RE | Admit: 2019-06-11 | Discharge: 2019-06-11 | Disposition: A | Discharge status: Home / Self Care | Payer: Self-pay | Source: Ambulatory Visit

## 2019-06-11 DIAGNOSIS — N898 Other specified noninflammatory disorders of vagina: Secondary | ICD-10-CM

## 2019-06-11 MED ORDER — METRONIDAZOLE 500 MG PO TABS: 500.0000 mg | ORAL_TABLET | Freq: Two times a day (BID) | ORAL | 0 refills | Status: DC

## 2019-06-11 MED ORDER — FLUCONAZOLE 150 MG PO TABS: 150.0000 mg | ORAL_TABLET | Freq: Every day | ORAL | 0 refills | Status: DC

## 2019-06-11 NOTE — ED Provider Notes (Signed)
Virtual Visit via Video Note:  Ann Santiago  initiated request for Telemedicine visit with [facility]. I connected with Ann Santiago  on 06/11/2019 at 11:55 AM  for a synchronized telemedicine visit using a video enabled HIPPA compliant telemedicine application. I verified that I am speaking with Ann Santiago  using two identifiers. Orvan July, NP  was physically located in a [facility] Urgent care site and Ann Santiago was located at a different location.   The limitations of evaluation and management by telemedicine as well as the availability of in-person appointments were discussed. Patient was informed that she  may incur a bill ( including co-pay) for this virtual visit encounter. Ann Santiago  expressed understanding and gave verbal consent to proceed with virtual visit.     History of Present Illness:Ann Santiago  is a 28 y.o. female presents with approximately 2 to 3 days of clear vaginal discharge, fishy odor.  Symptoms have been constant.  Mild itching and irritation to the vaginal area.  Denies any associated abdominal pain, back pain, fever, dysuria, hematuria or urinary frequency.  Currently sexually active, unprotected with one partner.  History of tubal ligation. No fever.  No dyspareunia   Past Medical History:  Diagnosis Date  . Anemia   . Headache   . History of chlamydia   . Preterm labor 02/01/2017  . Vaginal Pap smear, abnormal     No Known Allergies      Observations/Objective: VITALS: Per patient if applicable, see vitals. GENERAL: Alert, appears well and in no acute distress. HEENT: Atraumatic, conjunctiva clear, no obvious abnormalities on inspection of external nose and ears. NECK: Normal movements of the head and neck. CARDIOPULMONARY: No increased WOB. Speaking in clear sentences. I:E ratio WNL.  MS: Moves all visible extremities without noticeable abnormality. PSYCH: Pleasant and cooperative, well-groomed. Speech normal rate and  rhythm. Affect is appropriate. Insight and judgement are appropriate. Attention is focused, linear, and appropriate.  NEURO: CN grossly intact. Oriented as arrived to appointment on time with no prompting. Moves both UE equally.  SKIN: No obvious lesions, wounds, erythema, or cyanosis noted on face or hands.     Assessment and Plan: Treating for bacterial vaginosis and yeast infection based on symptoms and history.   Follow Up Instructions: Recommended follow-up in person for STD screening if symptoms do not resolve with the medication    I discussed the assessment and treatment plan with the patient. The patient was provided an opportunity to ask questions and all were answered. The patient agreed with the plan and demonstrated an understanding of the instructions.   The patient was advised to call back or seek an in-person evaluation if the symptoms worsen or if the condition fails to improve as anticipated.     Orvan July, NP  06/11/2019 11:55 AM         Orvan July, NP 06/11/19 1209

## 2019-06-11 NOTE — Discharge Instructions (Signed)
Treating you for bacterial vaginosis and yeast infection If symptoms do not improve or worsen in a week you need to be seen in person.

## 2019-07-29 ENCOUNTER — Encounter (HOSPITAL_COMMUNITY): Payer: Self-pay

## 2019-07-29 ENCOUNTER — Ambulatory Visit (HOSPITAL_COMMUNITY)
Admission: EM | Admit: 2019-07-29 | Discharge: 2019-07-29 | Disposition: A | Discharge status: Home / Self Care | Payer: Commercial Managed Care - PPO | Attending: Family Medicine | Admitting: Family Medicine

## 2019-07-29 ENCOUNTER — Other Ambulatory Visit: Payer: Self-pay

## 2019-07-29 ENCOUNTER — Ambulatory Visit (INDEPENDENT_AMBULATORY_CARE_PROVIDER_SITE_OTHER)
Admission: RE | Admit: 2019-07-29 | Discharge: 2019-07-29 | Disposition: A | Discharge status: Home / Self Care | Payer: Medicaid Other | Source: Ambulatory Visit

## 2019-07-29 DIAGNOSIS — M79662 Pain in left lower leg: Secondary | ICD-10-CM

## 2019-07-29 DIAGNOSIS — M79606 Pain in leg, unspecified: Secondary | ICD-10-CM

## 2019-07-29 NOTE — Discharge Instructions (Signed)
Need to go to the ER for evaluation of your leg pain and swelling

## 2019-07-29 NOTE — ED Triage Notes (Signed)
Pt states she woke up 4 days ago having left pain, left knee pain, left foot pain. Pt states her left foot and the left calf was swelling last night. Pt is taking ibuprofen to help with the swelling but is not helping with the pain.

## 2019-07-29 NOTE — Discharge Instructions (Signed)
You need to come in person to be evaluated.

## 2019-07-29 NOTE — ED Provider Notes (Signed)
Virtual Visit via Video Note:  Ann Santiago  initiated request for Telemedicine visit with [facility] Urgent Care team. I connected with Ann Santiago  on 07/29/2019 at 1:25 PM  for a synchronized telemedicine visit using a video enabled HIPPA compliant telemedicine application. I verified that I am speaking with Ann Santiago  using two identifiers. Orvan July, NP  was physically located in a [facility] Urgent care site and NAMI STRAWDER was located at a different location.   The limitations of evaluation and management by telemedicine as well as the availability of in-person appointments were discussed. Patient was informed that she  may incur a bill ( including co-pay) for this virtual visit encounter. Ann Santiago  expressed understanding and gave verbal consent to proceed with virtual visit.     History of Present Illness:Ann Santiago  is a 28 y.o. female with past medical history of anemia, headache, chlamydia.  She presents today with lower extremity pain, swelling, burning.  There is calf pain, swelling, foot swelling. Burning pain.   Past Medical History:  Diagnosis Date  . Anemia   . Headache   . History of chlamydia   . Preterm labor 02/01/2017  . Vaginal Pap smear, abnormal     No Known Allergies      Observations/Objective:VITALS: Per patient if applicable, see vitals. GENERAL: Alert, appears well and in no acute distress. HEENT: Atraumatic, conjunctiva clear, no obvious abnormalities on inspection of external nose and ears. NECK: Normal movements of the head and neck. CARDIOPULMONARY: No increased WOB. Speaking in clear sentences. I:E ratio WNL.  MS: Moves all visible extremities without noticeable abnormality. PSYCH: Pleasant and cooperative, well-groomed. Speech normal rate and rhythm. Affect is appropriate. Insight and judgement are appropriate. Attention is focused, linear, and appropriate.  NEURO: CN grossly intact. Oriented as arrived to appointment on time with  no prompting. Moves both UE equally.  SKIN: No obvious lesions, wounds, erythema, or cyanosis noted on face or hands.     Assessment and Plan: Based on patient's symptoms and calf pain, swelling and foot swelling we will have her come in and be seen in person for evaluation of the leg and possible ultrasound to rule out DVT.  Patient understanding and agreed   Follow Up Instructions: com in for in person visit.     I discussed the assessment and treatment plan with the patient. The patient was provided an opportunity to ask questions and all were answered. The patient agreed with the plan and demonstrated an understanding of the instructions.   The patient was advised to call back or seek an in-person evaluation if the symptoms worsen or if the condition fails to improve as anticipated.    Orvan July, NP  07/29/2019 1:25 PM         Orvan July, NP 07/29/19 1325

## 2019-07-29 NOTE — ED Provider Notes (Signed)
[facility]    CSN: [id number] Arrival date & time: 07/29/19  1619      History   Chief Complaint Chief Complaint  Patient presents with  . Leg Pain  . Knee Pain  . Foot Pain    HPI Ann Santiago is a 28 y.o. female.   HPI The patient is complaining of a 3 day history of pain in her left leg. She states that the pain is the worst in the calf muscle and is concerned that her left calf muscle has swollen. The pain in the left calf muscle occasionally radiates to her left foot. She also complains of pain in the left buttock. This pain radiates down the lateral side of the left thigh.  The pain in the buttock is bad enough today that she cannot sit on it and has difficulty walking. She does not recall any trauma to the affected leg and has not engaged in any new exercises. She has not traveled recently, denies recent surgeries, denies hormonal therapies, and has never had a blood clot.  She has treated her pain with OTC ibuprofen, but this has not helped.        Past Medical History:  Diagnosis Date  . Anemia   . Headache   . History of chlamydia   . Preterm labor 02/01/2017  . Vaginal Pap smear, abnormal     Patient Active Problem List   Diagnosis Date Noted  . Normal vaginal delivery 02/04/2017  . Preterm labor 02/01/2017  . Normal labor 02/01/2017  . Late prenatal care, antepartum 11/22/2016    Past Surgical History:  Procedure Laterality Date  . TUBAL LIGATION Bilateral 02/02/2017   Procedure: POST PARTUM TUBAL LIGATION;  Surgeon: [place] BingPickens, Charlie, MD;  [facility];  Service: Gynecology;  Laterality: Bilateral;  . WISDOM TOOTH EXTRACTION      OB History    Gravida  4   Para  4   Term  3   Preterm  1   AB      Living  4     SAB      TAB      Ectopic      Multiple  0   Live Births  4            Home Medications    Prior to Admission medications   Medication Sig Start Date End Date Taking? Authorizing Provider   ibuprofen (ADVIL) 200 MG tablet Take 200 mg by mouth every 6 (six) hours as needed.   Yes [provider]  fluticasone (FLONASE) 50 MCG/ACT nasal spray Place 2 sprays into both nostrils daily. Patient not taking: Reported on 03/05/2019 07/24/17 06/11/19  Belinda FisherYu, Amy V, PA-C  rizatriptan (MAXALT) 5 MG tablet Take 1 tablet (5 mg total) by mouth as needed for migraine. May repeat in 2 hours if needed Patient not taking: Reported on 03/05/2019 10/03/18 06/11/19  Frederica KusterMiller, Stephen M, MD    Family History Family History  Problem Relation Age of Onset  . Hypertension Maternal Grandmother     Social History Social History   Tobacco Use  . Smoking status: Never Smoker  . Smokeless tobacco: Never Used  Substance Use Topics  . Alcohol use: No    Comment: social  . Drug use: No     Allergies   Patient has no known allergies.   Review of Systems Review of Systems  Constitutional: Negative for chills and fever.  HENT: Negative for ear pain  and sore throat.   Eyes: Negative for pain and visual disturbance.  Respiratory: Negative for cough and shortness of breath.   Cardiovascular: Positive for leg swelling. Negative for chest pain and palpitations.  Gastrointestinal: Negative for abdominal pain and vomiting.  Genitourinary: Negative for dysuria and hematuria.  Musculoskeletal: Positive for gait problem. Negative for arthralgias and back pain.  Skin: Negative for color change and rash.  Neurological: Negative for seizures and syncope.  All other systems reviewed and are negative.    Physical Exam Triage Vital Signs ED Triage Vitals  Enc Vitals Group     BP 07/29/19 1646 107/65     Pulse Rate 07/29/19 1646 70     Resp 07/29/19 1646 15     Temp 07/29/19 1646 (!) 97.5 F (36.4 C)     Temp Source 07/29/19 1646 Temporal     SpO2 07/29/19 1646 100 %     Weight --      Height --      Head Circumference --      Peak Flow --      Pain Score 07/29/19 1644 9     Pain Loc --      Pain  Edu? --      Excl. in GC? --    No data found.  Updated Vital Signs BP 107/65 (BP Location: Left Arm)   Pulse 70   Temp (!) 97.5 F (36.4 C) (Temporal)   Resp 15   LMP  (Within Weeks)   SpO2 100%       Physical Exam Constitutional:      General: She is in acute distress.     Appearance: She is well-developed and normal weight.     Comments: Patient is acutely uncomfortable.  She is in wheelchair.  She is sitting over on her her right hip with her left leg extended.  Slow guarded movements  HENT:     Head: Normocephalic and atraumatic.     Mouth/Throat:     Comments: Mask in place Eyes:     Conjunctiva/sclera: Conjunctivae normal.     Pupils: Pupils are equal, round, and reactive to light.  Neck:     Musculoskeletal: Normal range of motion.  Cardiovascular:     Rate and Rhythm: Normal rate and regular rhythm.     Heart sounds: Normal heart sounds.  Pulmonary:     Effort: Pulmonary effort is normal. No respiratory distress.     Breath sounds: Normal breath sounds.  Abdominal:     General: There is no distension.     Palpations: Abdomen is soft.  Musculoskeletal: Normal range of motion.     Comments: Patient has visible unmeasurable swelling in the right thigh and right calf.  Diffuse tenderness from the buttock through the thigh into the calf to palpation of the muscles.  Straight leg raise is negative.  Reflexes are intact.  Sensations intact.  Skin:    General: Skin is warm and dry.  Neurological:     Mental Status: She is alert.      UC Treatments / Results  Labs (all labs ordered are listed, but only abnormal results are displayed) Labs Reviewed - No data to display  EKG   Radiology No results found.  Procedures Procedures (including critical care time)  Medications Ordered in UC Medications - No data to display  Initial Impression / Assessment and Plan / UC Course  I have reviewed the triage vital signs and the nursing notes.  Pertinent labs &  imaging results that were available during my care of the patient were reviewed by me and considered in my medical decision making (see chart for details).     Patient had a video visit 9 AM this morning was told to either come here to the ER for evaluation of a possible blood clot.  Patient instead went to work, and did not come here until well after 5:00 in the afternoon.  I am unable to do any DVT evaluation or imaging at this time.  I am unable to explain her pain given her physical examination.,  I do not have access to advanced imaging.  I explained to her that she needs to go the emergency room for additional care Final Clinical Impressions(s) / UC Diagnoses   Final diagnoses:  Pain of left lower leg     Discharge Instructions     Need to go to the ER for evaluation of your leg pain and swelling    ED Prescriptions    None     PDMP not reviewed this encounter.   Raylene Everts, MD 07/29/19 2107

## 2019-09-04 ENCOUNTER — Other Ambulatory Visit: Payer: Self-pay

## 2019-09-05 ENCOUNTER — Encounter

## 2019-09-05 ENCOUNTER — Ambulatory Visit: Payer: Commercial Managed Care - PPO | Admitting: Internal Medicine

## 2019-10-20 ENCOUNTER — Ambulatory Visit (INDEPENDENT_AMBULATORY_CARE_PROVIDER_SITE_OTHER)
Admission: RE | Admit: 2019-10-20 | Discharge: 2019-10-20 | Disposition: A | Discharge status: Home / Self Care | Payer: Commercial Managed Care - PPO | Source: Ambulatory Visit

## 2019-10-20 DIAGNOSIS — J069 Acute upper respiratory infection, unspecified: Secondary | ICD-10-CM

## 2019-10-20 MED ORDER — FLUTICASONE PROPIONATE 50 MCG/ACT NA SUSP: 1.0000 | Freq: Every day | NASAL | 0 refills | Status: DC

## 2019-10-20 NOTE — Discharge Instructions (Signed)
Come to the Urgent Care to get a COVID test.    Use the Flonase nasal spray as directed.    You should self quarantine until your COVID test result is back and is negative.    Take Tylenol as needed for fever or discomfort.  Rest and keep yourself hydrated with 8-10 glasses of water each day.    Go to the emergency department if you develop high fever, shortness of breath, severe diarrhea, or other concerning symptoms.

## 2019-10-20 NOTE — ED Provider Notes (Signed)
Virtual Visit via Video Note:  Ann Santiago  initiated request for Telemedicine visit with [facility] Urgent Care team. I connected with Ann Santiago  on 10/20/2019 at 4:25 PM  for a synchronized telemedicine visit using a video enabled HIPPA compliant telemedicine application. I verified that I am speaking with Ann Santiago  using two identifiers. Mickie Bail, NP  was physically located in a [facility] Urgent care site and Ann Santiago was located at a different location.   The limitations of evaluation and management by telemedicine as well as the availability of in-person appointments were discussed. Patient was informed that she  may incur a bill ( including co-pay) for this virtual visit encounter. Ann Santiago  expressed understanding and gave verbal consent to proceed with virtual visit.     History of Present Illness:Ann Santiago  is a 29 y.o. female presents for evaluation of sinus drainage, sinus pressure, sinus headache x 3 days.  She denies fever, chills, sore throat, cough, shortness of breath, vomiting, diarrhea, rash, or other symptoms.  Treatment attempted at home with Leota Sauers and Tylenol Cold & Flu.  LMP: 10/04/2019; not breastfeeding.  No recent COVID test.    No Known Allergies   Past Medical History:  Diagnosis Date  . Anemia   . Headache   . History of chlamydia   . Preterm labor 02/01/2017  . Vaginal Pap smear, abnormal      Social History   Tobacco Use  . Smoking status: Never Smoker  . Smokeless tobacco: Never Used  Substance Use Topics  . Alcohol use: No    Comment: social  . Drug use: No        Observations/Objective: Physical Exam  VITALS: Patient denies fever. GENERAL: Alert, appears well and in no acute distress. HEENT: Atraumatic. NECK: Normal movements of the head and neck. CARDIOPULMONARY: No increased WOB. Speaking in clear sentences. I:E ratio WNL.  MS: Moves all visible extremities without noticeable abnormality. PSYCH: Pleasant  and cooperative, well-groomed. Speech normal rate and rhythm. Affect is appropriate. Insight and judgement are appropriate. Attention is focused, linear, and appropriate.  NEURO: CN grossly intact. Oriented as arrived to appointment on time with no prompting. Moves both UE equally.  SKIN: No obvious lesions, wounds, erythema, or cyanosis noted on face or hands.   Assessment and Plan:    ICD-10-CM   1. Acute upper respiratory infection  J06.9        Follow Up Instructions: Treating with Flonase nasal spray.  Instructed patient to come to the urgent care to receive a COVID test.  Instructed her to self quarantine until the test result is back.  Discussed with patient that she can take Tylenol as needed for fever or discomfort.  Instructed patient to go to the emergency department if she develops high fever, shortness of breath, severe diarrhea, or other concerning symptoms.  Patient agrees with plan of care.      I discussed the assessment and treatment plan with the patient. The patient was provided an opportunity to ask questions and all were answered. The patient agreed with the plan and demonstrated an understanding of the instructions.   The patient was advised to call back or seek an in-person evaluation if the symptoms worsen or if the condition fails to improve as anticipated.      Mickie Bail, NP  10/20/2019 4:25 PM         Mickie Bail, NP 10/20/19 1625

## 2019-11-27 ENCOUNTER — Ambulatory Visit: Payer: Commercial Managed Care - PPO | Admitting: Internal Medicine

## 2019-12-30 ENCOUNTER — Encounter: Payer: Self-pay | Admitting: Internal Medicine

## 2019-12-30 VITALS — Ht 61.0 in | Wt 120.0 lb

## 2020-03-11 ENCOUNTER — Ambulatory Visit: Payer: Medicaid Other | Admitting: Obstetrics

## 2020-03-25 ENCOUNTER — Encounter: Payer: Self-pay | Admitting: Obstetrics

## 2020-03-25 ENCOUNTER — Other Ambulatory Visit: Payer: Self-pay

## 2020-03-25 ENCOUNTER — Other Ambulatory Visit (HOSPITAL_COMMUNITY)
Admission: RE | Admit: 2020-03-25 | Discharge: 2020-03-25 | Disposition: A | Discharge status: Home / Self Care | Payer: Medicaid Other | Source: Ambulatory Visit | Attending: Obstetrics | Admitting: Obstetrics

## 2020-03-25 ENCOUNTER — Ambulatory Visit (INDEPENDENT_AMBULATORY_CARE_PROVIDER_SITE_OTHER): Payer: Self-pay | Admitting: Obstetrics

## 2020-03-25 VITALS — BP 120/79 | HR 74 | Ht 61.0 in | Wt 115.9 lb

## 2020-03-25 DIAGNOSIS — N898 Other specified noninflammatory disorders of vagina: Secondary | ICD-10-CM

## 2020-03-25 DIAGNOSIS — G43109 Migraine with aura, not intractable, without status migrainosus: Secondary | ICD-10-CM

## 2020-03-25 DIAGNOSIS — Z01419 Encounter for gynecological examination (general) (routine) without abnormal findings: Secondary | ICD-10-CM | POA: Diagnosis not present

## 2020-03-25 MED ORDER — BUTALBITAL-APAP-CAFFEINE 50-325-40 MG PO TABS: 2.0000 | ORAL_TABLET | Freq: Four times a day (QID) | ORAL | 2 refills | Status: AC | PRN

## 2020-03-25 NOTE — Progress Notes (Signed)
Subjective:        Ann Santiago is a 29 y.o. female here for a routine exam.  Current complaints: None.    Personal health questionnaire:  Is patient [name], have a family history of breast and/or ovarian cancer: no Is there a family history of uterine cancer diagnosed at age < 60, gastrointestinal cancer, urinary tract cancer, family member who is a Personnel officer syndrome-associated carrier: no Is the patient overweight and hypertensive, family history of diabetes, personal history of gestational diabetes, preeclampsia or PCOS: no Is patient over 70, have PCOS,  family history of premature CHD under age 32, diabetes, smoke, have hypertension or peripheral artery disease:  no At any time, has a partner hit, kicked or otherwise hurt or frightened you?: no Over the past 2 weeks, have you felt down, depressed or hopeless?: no Over the past 2 weeks, have you felt little interest or pleasure in doing things?:no   Gynecologic History Patient's last menstrual period was 02/23/2020. Contraception: tubal ligation Last Pap: 12-06-2016. Results were: normal Last mammogram: n/a. Results were: n/a  Obstetric History OB History  Gravida Para Term Preterm AB Living  4 4 3 1   4   SAB TAB Ectopic Multiple Live Births        0 4    # Outcome Date GA Lbr Len/2nd Weight Sex Delivery Anes PTL Lv  4 Preterm 02/02/17 [redacted]w[redacted]d 14:02 / 00:01 5 lb 13.8 oz (2.66 kg) M Vag-Spont None  LIV  3 Term 04/13/16 [redacted]w[redacted]d 05:56 / 00:16 6 lb 6.8 oz (2.914 kg) M Vag-Spont EPI  LIV  2 Term 05/05/12 [redacted]w[redacted]d 06:30 / 00:05 6 lb 8.4 oz (2.96 kg) F Vag-Spont None  LIV  1 Term 2011 [redacted]w[redacted]d  5 lb 11 oz (2.58 kg) F Vag-Spont   LIV    Past Medical History:  Diagnosis Date   Anemia    Headache    History of chlamydia    Preterm labor 02/01/2017   Vaginal Pap smear, abnormal     Past Surgical History:  Procedure Laterality Date   TUBAL LIGATION Bilateral 02/02/2017   Procedure: POST PARTUM TUBAL LIGATION;  Surgeon:  04/04/2017, MD;  Location: [facility];  Service: Gynecology;  Laterality: Bilateral;   WISDOM TOOTH EXTRACTION       Current Outpatient Medications:    butalbital-acetaminophen-caffeine (FIORICET) 50-325-40 MG tablet, Take 2 tablets by mouth every 6 (six) hours as needed for headache., Disp: 30 tablet, Rfl: 2   fluticasone (FLONASE) 50 MCG/ACT nasal spray, Place 1 spray into both nostrils daily. (Patient not taking: Reported on 03/25/2020), Disp: 16 g, Rfl: 0   ibuprofen (ADVIL) 200 MG tablet, Take 200 mg by mouth every 6 (six) hours as needed. (Patient not taking: Reported on 03/25/2020), Disp: , Rfl:  No Known Allergies  Social History   Tobacco Use   Smoking status: Never Smoker   Smokeless tobacco: Never Used  Substance Use Topics   Alcohol use: No    Comment: social    Family History  Problem Relation Age of Onset   Hypertension Maternal Grandmother       Review of Systems  Constitutional: negative for fatigue and weight loss Respiratory: negative for cough and wheezing Cardiovascular: negative for chest pain, fatigue and palpitations Gastrointestinal: negative for abdominal pain and change in bowel habits Musculoskeletal:negative for myalgias Neurological: negative for gait problems and tremors Behavioral/Psych: negative for abusive relationship, depression Endocrine: negative for temperature intolerance    Genitourinary:negative for abnormal  menstrual periods, genital lesions, hot flashes, sexual problems and vaginal discharge Integument/breast: negative for breast lump, breast tenderness, nipple discharge and skin lesion(s)    Objective:       BP 120/79    Pulse 74    Ht 5\' 1"  (1.549 m)    Wt 115 lb 14.4 oz (52.6 kg)    LMP 02/23/2020    BMI 21.90 kg/m  General:   alert  Skin:   no rash or abnormalities  Lungs:   clear to auscultation bilaterally  Heart:   regular rate and rhythm, S1, S2 normal, no murmur, click, rub or gallop  Breasts:    normal without suspicious masses, skin or nipple changes or axillary nodes  Abdomen:  normal findings: no organomegaly, soft, non-tender and no hernia  Pelvis:  External genitalia: normal general appearance Urinary system: urethral meatus normal and bladder without fullness, nontender Vaginal: normal without tenderness, induration or masses Cervix: normal appearance Adnexa: normal bimanual exam Uterus: anteverted and non-tender, normal size   Lab Review Urine pregnancy test Labs reviewed yes Radiologic studies reviewed no  50% of 20 min visit spent on counseling and coordination of care.   Assessment:     1. Encounter for routine gynecological examination with Papanicolaou smear of cervix Rx: - Cytology - PAP( [place])  2. Vaginal discharge Rx: - Cervicovaginal ancillary only( [place])  3. Migraine with aura and without status migrainosus, not intractable Rx: - butalbital-acetaminophen-caffeine (FIORICET) 50-325-40 MG tablet; Take 2 tablets by mouth every 6 (six) hours as needed for headache.  Dispense: 30 tablet; Refill: 2 - Ambulatory referral to Neurology    Plan:    Education reviewed: calcium supplements, depression evaluation, low fat, low cholesterol diet, safe sex/STD prevention, self breast exams and weight bearing exercise. Follow up in: 1 year.   Meds ordered this encounter  Medications   butalbital-acetaminophen-caffeine (FIORICET) 50-325-40 MG tablet    Sig: Take 2 tablets by mouth every 6 (six) hours as needed for headache.    Dispense:  30 tablet    Refill:  2   Orders Placed This Encounter  Procedures   Ambulatory referral to Neurology    Referral Priority:   Routine    Referral Type:   Consultation    Referral Reason:   Specialty Services Required    Requested Specialty:   Neurology    Number of Visits Requested:   1    Shelly Bombard, MD 03/25/2020 2:29 PM

## 2020-03-25 NOTE — Progress Notes (Signed)
Patient is in the office for annual. Last pap 12-06-2016 Hx of tubal in 2018. Pt wants to discuss possible reversal Pt reports vaginal discharge and odor. Advised of [place] medicaid, pt still desires bvag and cvag testing.

## 2020-03-26 LAB — CERVICOVAGINAL ANCILLARY ONLY
Bacterial Vaginitis (gardnerella): NEGATIVE
Candida Glabrata: NEGATIVE
Candida Vaginitis: POSITIVE — AB
Chlamydia: NEGATIVE
Comment: NEGATIVE
Comment: NEGATIVE
Comment: NEGATIVE
Comment: NEGATIVE
Comment: NEGATIVE
Comment: NORMAL
Neisseria Gonorrhea: NEGATIVE
Trichomonas: NEGATIVE

## 2020-03-26 LAB — CYTOLOGY - PAP
Comment: NEGATIVE
Diagnosis: NEGATIVE
High risk HPV: NEGATIVE

## 2020-03-28 ENCOUNTER — Other Ambulatory Visit: Payer: Self-pay | Admitting: Obstetrics

## 2020-03-28 DIAGNOSIS — B3731 Acute candidiasis of vulva and vagina: Secondary | ICD-10-CM

## 2020-03-28 MED ORDER — FLUCONAZOLE 150 MG PO TABS: 150.0000 mg | ORAL_TABLET | Freq: Once | ORAL | 0 refills | Status: AC

## 2020-04-01 ENCOUNTER — Other Ambulatory Visit: Payer: Self-pay | Admitting: Obstetrics

## 2020-04-01 DIAGNOSIS — N971 Female infertility of tubal origin: Secondary | ICD-10-CM

## 2020-04-21 DIAGNOSIS — N915 Oligomenorrhea, unspecified: Secondary | ICD-10-CM | POA: Diagnosis not present

## 2020-04-21 DIAGNOSIS — G43009 Migraine without aura, not intractable, without status migrainosus: Secondary | ICD-10-CM | POA: Diagnosis not present

## 2020-04-27 DIAGNOSIS — F4321 Adjustment disorder with depressed mood: Secondary | ICD-10-CM | POA: Diagnosis not present

## 2020-04-27 DIAGNOSIS — G4700 Insomnia, unspecified: Secondary | ICD-10-CM | POA: Diagnosis not present

## 2020-04-27 DIAGNOSIS — Z634 Disappearance and death of family member: Secondary | ICD-10-CM | POA: Diagnosis not present

## 2020-05-25 DIAGNOSIS — Z920 Personal history of contraception: Secondary | ICD-10-CM | POA: Diagnosis not present

## 2020-05-25 DIAGNOSIS — Z310 Encounter for reversal of previous sterilization: Secondary | ICD-10-CM | POA: Diagnosis not present

## 2020-05-30 NOTE — Progress Notes (Deleted)
[facility]    Provider:  Dr Lucia Gaskins Requesting Provider: Brock Bad, MD Primary Care Provider:  Patient, [name]  CC:  ***  HPI:  Ann Santiago is a 29 y.o. female here as requested by Brock Bad, MD for migraines.  Past medical history anemia, headache. I reviewed Ann Santiago notes which did not include any history of her headache disorder however he did give her Fioricet 30 tablets and referred her to neurology.  In the past she also saw ophthalmology, Ann Santiago's group at [facility], diagnosed with "favoring functional vision loss given history and exam findings".  Reviewed notes, labs and imaging from outside physicians, which showed ***  I reviewed patient's history, she was seen by [facility] health urgent care team televisit for sinus drainage, sinus pressure and sinus headache in January 2021.  At that time she did not describe any migrainous symptoms.  She was treated with Flonase spray, Tylenol.  She was also seen in the urgent care/emergency room multiple times over the last year including October 2020, September 2020, June 2020 however those are not related to any headache disorders.  I do not see any other notes for headache symptoms or any imaging of the brain.  I did see she had Humphrey visual fields testing and OCT and optic disc photos at [facility] in June 2019 for vision loss, funduscopic exam with normal discs, macula, vessels and periphery, symmetric temporal thinning on OCT likely due to moderate myopia, favoring functional vision loss given history and exam findings.  Review of Systems: Patient complains of symptoms per HPI as well as the following symptoms ***. Pertinent negatives and positives per HPI. All others negative.   Social History   Socioeconomic History  . Marital status: Single    Spouse name: Not on file  . Number of children: Not on file  . Years of education: Not on file  . Highest education level: Not on  file  Occupational History  . Not on file  Tobacco Use  . Smoking status: Never Smoker  . Smokeless tobacco: Never Used  Vaping Use  . Vaping Use: Never used  Substance and Sexual Activity  . Alcohol use: No    Comment: social  . Drug use: No  . Sexual activity: Yes    Partners: Male    Birth control/protection: Surgical  Other Topics Concern  . Not on file  Social History Narrative  . Not on file   Social Determinants of Health   Financial Resource Strain:   . Difficulty of Paying Living Expenses: Not on file  Food Insecurity:   . Worried About Programme researcher, broadcasting/film/video in the Last Year: Not on file  . Ran Out of Food in the Last Year: Not on file  Transportation Needs:   . Lack of Transportation (Medical): Not on file  . Lack of Transportation (Non-Medical): Not on file  Physical Activity:   . Days of Exercise per Week: Not on file  . Minutes of Exercise per Session: Not on file  Stress:   . Feeling of Stress : Not on file  Social Connections:   . Frequency of Communication with Friends and Family: Not on file  . Frequency of Social Gatherings with Friends and Family: Not on file  . Attends Religious Services: Not on file  . Active Member of Clubs or Organizations: Not on file  . Attends Banker Meetings: Not on file  . Marital Status: Not  on file  Intimate Partner Violence:   . Fear of Current or Ex-Partner: Not on file  . Emotionally Abused: Not on file  . Physically Abused: Not on file  . Sexually Abused: Not on file    Family History  Problem Relation Age of Onset  . Hypertension Maternal Grandmother     Past Medical History:  Diagnosis Date  . Anemia   . Headache   . History of chlamydia   . Preterm labor 02/01/2017  . Vaginal Pap smear, abnormal     Patient Active Problem List   Diagnosis Date Noted  . Normal vaginal delivery 02/04/2017  . Preterm labor 02/01/2017  . Normal labor 02/01/2017  . Late prenatal care, antepartum 11/22/2016      Past Surgical History:  Procedure Laterality Date  . TUBAL LIGATION Bilateral 02/02/2017   Procedure: POST PARTUM TUBAL LIGATION;  Surgeon: Kiron Bing, MD;  Location: [facility];  Service: Gynecology;  Laterality: Bilateral;  . WISDOM TOOTH EXTRACTION      Current Outpatient Medications  Medication Sig Dispense Refill  . butalbital-acetaminophen-caffeine (FIORICET) 50-325-40 MG tablet Take 2 tablets by mouth every 6 (six) hours as needed for headache. 30 tablet 2  . fluticasone (FLONASE) 50 MCG/ACT nasal spray Place 1 spray into both nostrils daily. (Patient not taking: Reported on 03/25/2020) 16 g 0  . ibuprofen (ADVIL) 200 MG tablet Take 200 mg by mouth every 6 (six) hours as needed. (Patient not taking: Reported on 03/25/2020)     No current facility-administered medications for this visit.    Allergies as of 05/31/2020  . (No Known Allergies)    Vitals: There were no vitals taken for this visit. Last Weight:  Wt Readings from Last 1 Encounters:  03/25/20 115 lb 14.4 oz (52.6 kg)   Last Height:   Ht Readings from Last 1 Encounters:  03/25/20 5\' 1"  (1.549 m)     Physical exam: Exam: Gen: NAD, conversant, well nourised, obese, well groomed                     CV: RRR, no MRG. No Carotid Bruits. No peripheral edema, warm, nontender Eyes: Conjunctivae clear without exudates or hemorrhage  Neuro: Detailed Neurologic Exam  Speech:    Speech is normal; fluent and spontaneous with normal comprehension.  Cognition:    The patient is oriented to person, place, and time;     recent and remote memory intact;     language fluent;     normal attention, concentration,     fund of knowledge Cranial Nerves:    The pupils are equal, round, and reactive to light. The fundi are normal and spontaneous venous pulsations are present. Visual fields are full to finger confrontation. Extraocular movements are intact. Trigeminal sensation is intact and the muscles of  mastication are normal. The face is symmetric. The palate elevates in the midline. Hearing intact. Voice is normal. Shoulder shrug is normal. The tongue has normal motion without fasciculations.   Coordination:    Normal finger to nose and heel to shin. Normal rapid alternating movements.   Gait:    Heel-toe and tandem gait are normal.   Motor Observation:    No asymmetry, no atrophy, and no involuntary movements noted. Tone:    Normal muscle tone.    Posture:    Posture is normal. normal erect    Strength:    Strength is V/V in the upper and lower limbs.  Sensation: intact to LT     Reflex Exam:  DTR's:    Deep tendon reflexes in the upper and lower extremities are normal bilaterally.   Toes:    The toes are downgoing bilaterally.   Clonus:    Clonus is absent.    Assessment/Plan:    No orders of the defined types were placed in this encounter.  No orders of the defined types were placed in this encounter.   Cc: Brock Bad, MD,  Patient, [name]  Naomie Dean, MD  [facility] [address] [place], [place] [address]  Phone [phone] Fax [phone]

## 2020-05-31 ENCOUNTER — Encounter: Payer: Self-pay | Admitting: Neurology

## 2020-05-31 ENCOUNTER — Ambulatory Visit: Payer: Managed Care, Other (non HMO) | Admitting: Neurology

## 2020-05-31 ENCOUNTER — Telehealth: Payer: Self-pay | Admitting: *Deleted

## 2020-05-31 NOTE — Telephone Encounter (Signed)
Pt no showed new patient appt today. This is her first no show at [facility].  

## 2020-06-09 DIAGNOSIS — R5383 Other fatigue: Secondary | ICD-10-CM | POA: Diagnosis not present

## 2020-06-09 DIAGNOSIS — E559 Vitamin D deficiency, unspecified: Secondary | ICD-10-CM | POA: Diagnosis not present

## 2020-06-09 DIAGNOSIS — F411 Generalized anxiety disorder: Secondary | ICD-10-CM | POA: Diagnosis not present

## 2020-06-09 DIAGNOSIS — F331 Major depressive disorder, recurrent, moderate: Secondary | ICD-10-CM | POA: Diagnosis not present

## 2021-04-15 IMAGING — CT CT RENAL STONE PROTOCOL
2 of 4 series · 15 of 46 positions shown, 17 images · non-contrast
Comparison: 02/28/2019, CT from 11/17/2018

CLINICAL DATA: Bilateral flank pain

EXAM:
CT ABDOMEN AND PELVIS WITHOUT CONTRAST
TECHNIQUE: Multidetector CT imaging of the abdomen and pelvis was performed
following the standard protocol without IV contrast.

[Series 2: axial st · axial · 0.63mm/px · z∈[-469,-69]mm · 12 of 90 slices shown, 14 images]
[im 5/90  soft-tissue]
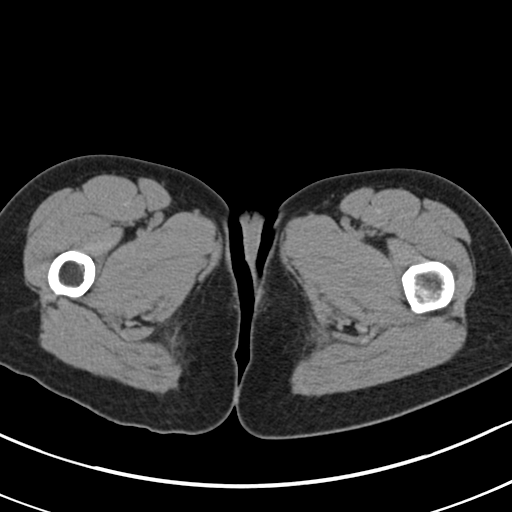
[im 5/90  bone]
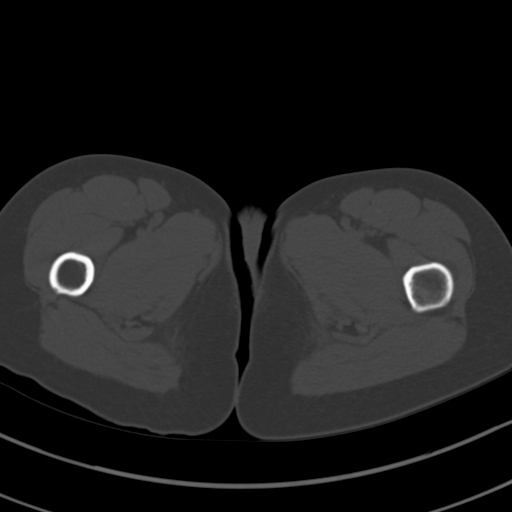
[im 13/90  soft-tissue]
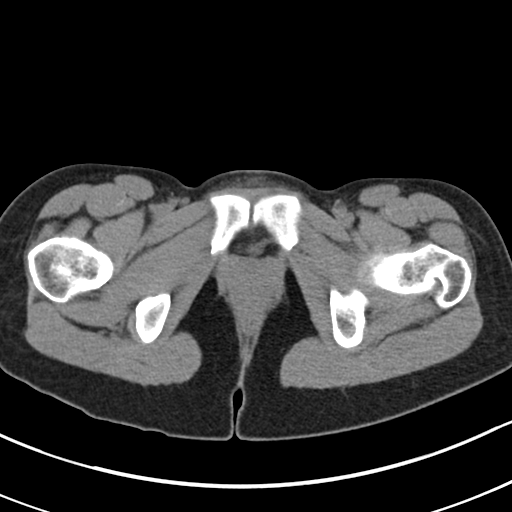
[im 22/90  soft-tissue]
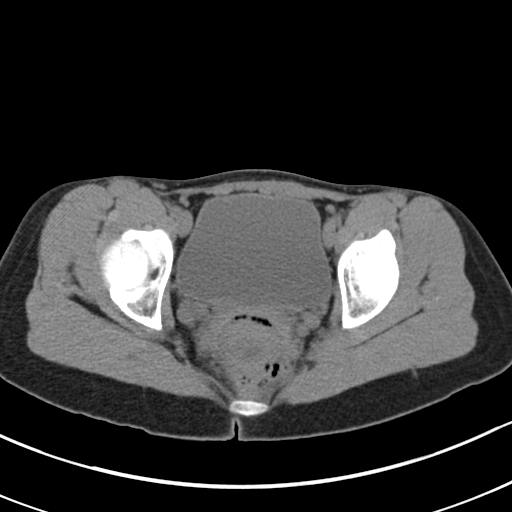
[im 26/90  soft-tissue]
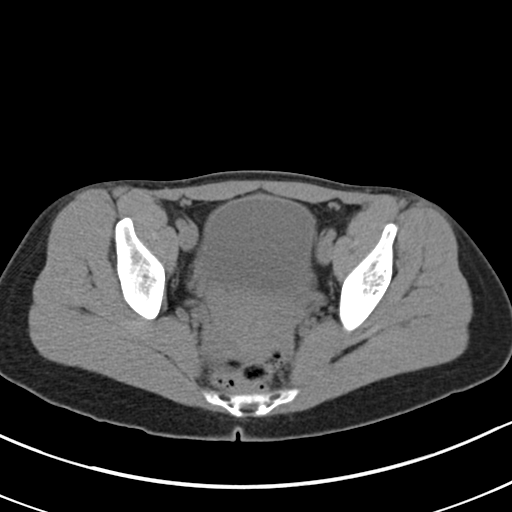
[im 34/90  soft-tissue]
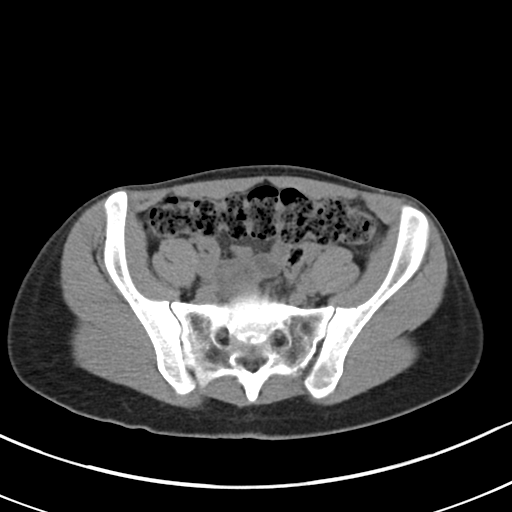
[im 43/90  soft-tissue]
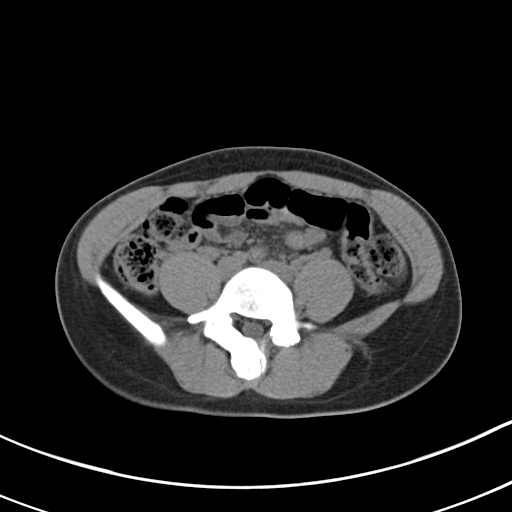
[im 47/90  soft-tissue]
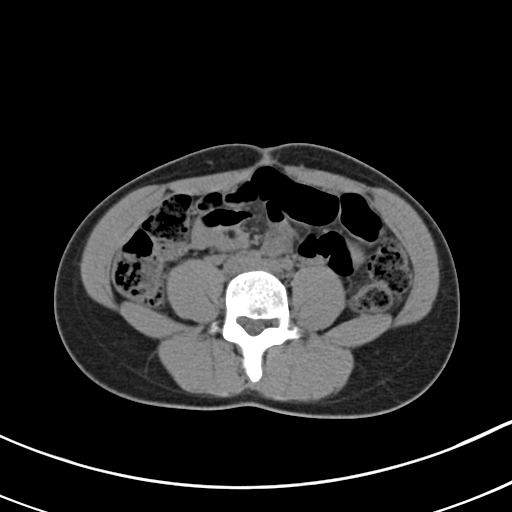
[im 56/90  soft-tissue]
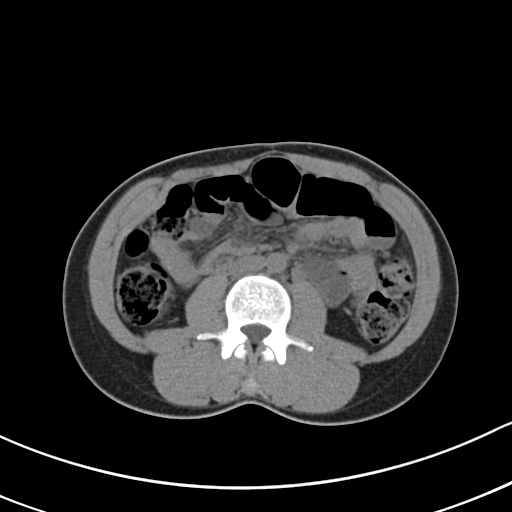
[im 64/90  soft-tissue]
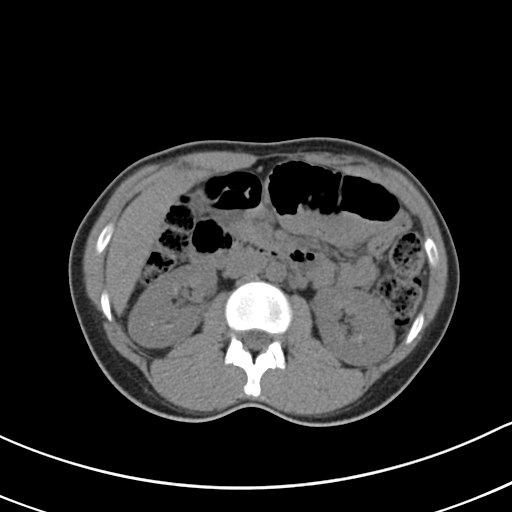
[im 64/90  bone]
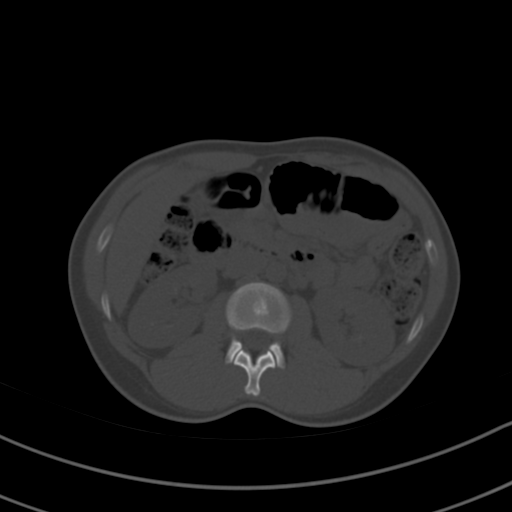
[im 68/90  soft-tissue]
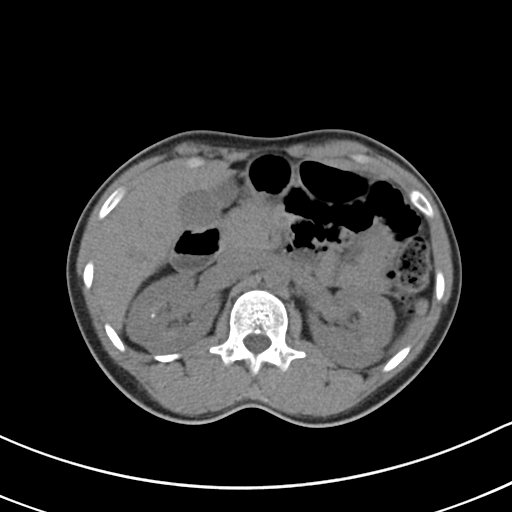
[im 77/90  soft-tissue]
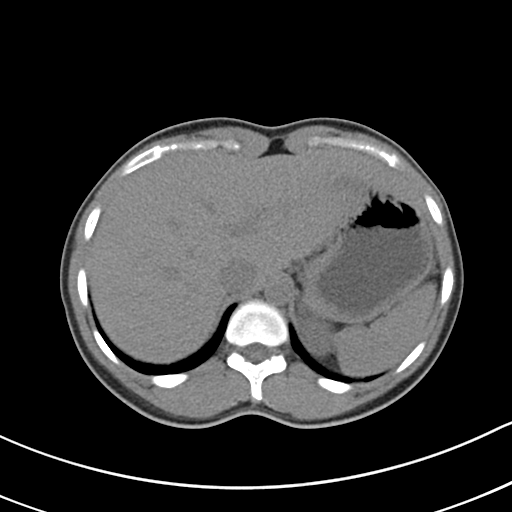
[im 85/90  soft-tissue]
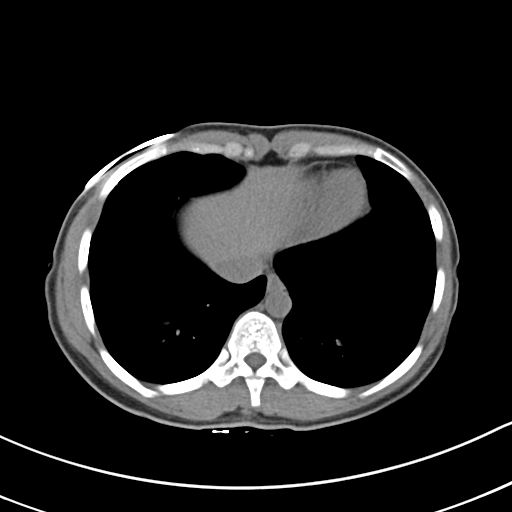

[Series 4: coronal · coronal · 0.56mm/px · 3 of 104 slices shown]
[im 35/104  soft-tissue]
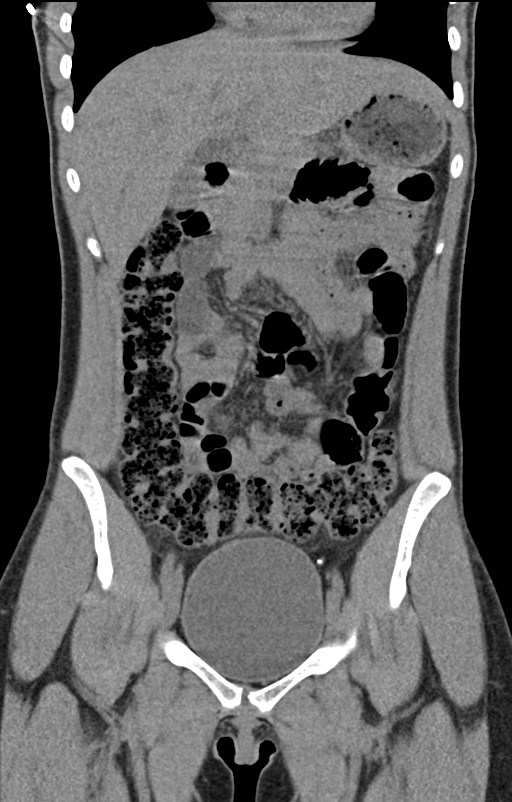
[im 46/104  soft-tissue]
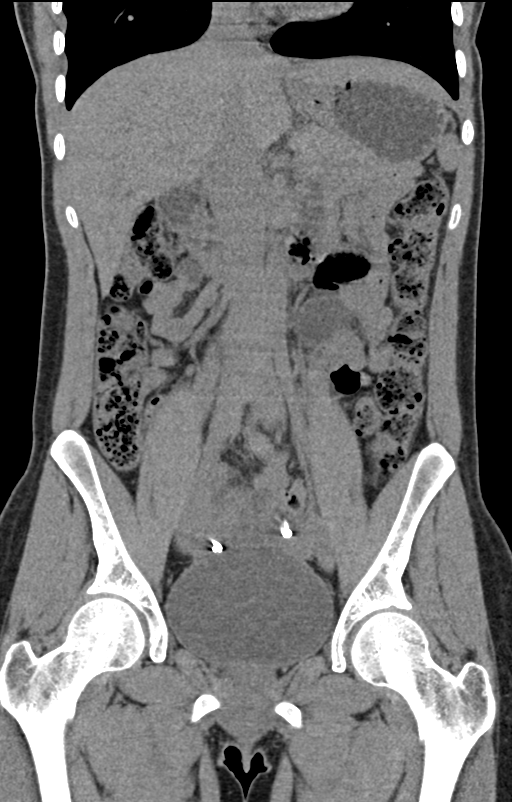
[im 58/104  soft-tissue]
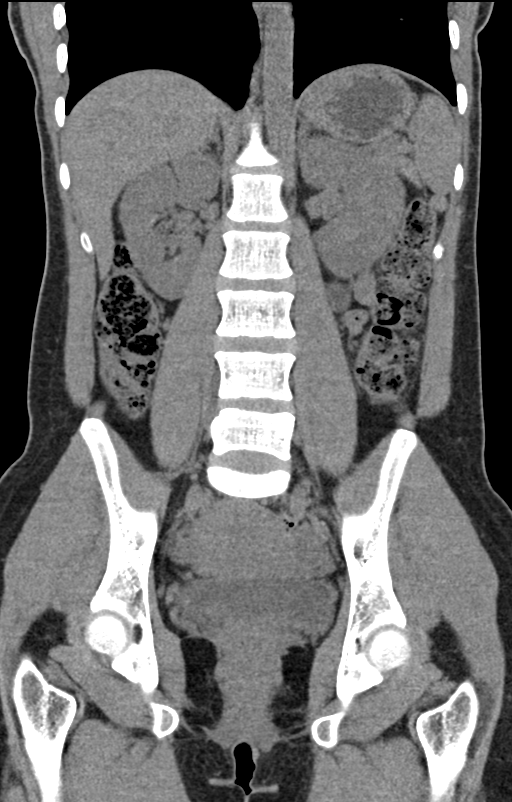

[15 of 46 positions shown; findings below may reference images not displayed]

FINDINGS: Lower chest: No acute abnormality.

Hepatobiliary: No focal liver abnormality is seen. No gallstones,
gallbladder wall thickening, or biliary dilatation.

Pancreas: Unremarkable. No pancreatic ductal dilatation or
surrounding inflammatory changes.

Spleen: Normal in size without focal abnormality.

Adrenals/Urinary Tract: Adrenal glands are within normal limits.
Kidneys demonstrate multiple small bilateral renal calculi similar
to that seen on prior exam. No obstructive changes are seen. Bladder
is well distended.

Stomach/Bowel: The appendix is within normal limits. The large and
small bowel are unremarkable. Stomach is within normal limits.

Vascular/Lymphatic: No significant vascular findings are present. No
enlarged abdominal or pelvic lymph nodes.

Reproductive: Uterus and bilateral adnexa are unremarkable. Changes
of tubal ligation are noted.

Other: No abdominal wall hernia or abnormality. No abdominopelvic
ascites.

Musculoskeletal: No acute or significant osseous findings.
IMPRESSION: Bilateral small renal calculi. The largest of these measures
approximately 2 mm bilaterally.

No obstructive changes are noted.

## 2021-10-04 DIAGNOSIS — G4700 Insomnia, unspecified: Secondary | ICD-10-CM | POA: Diagnosis not present

## 2021-10-04 DIAGNOSIS — Z7689 Persons encountering health services in other specified circumstances: Secondary | ICD-10-CM | POA: Diagnosis not present

## 2021-10-04 DIAGNOSIS — F411 Generalized anxiety disorder: Secondary | ICD-10-CM | POA: Diagnosis not present

## 2021-10-04 DIAGNOSIS — G44229 Chronic tension-type headache, not intractable: Secondary | ICD-10-CM | POA: Diagnosis not present

## 2021-11-29 DIAGNOSIS — G43009 Migraine without aura, not intractable, without status migrainosus: Secondary | ICD-10-CM | POA: Diagnosis not present

## 2022-03-01 ENCOUNTER — Encounter: Payer: Medicaid Other | Admitting: Physician Assistant

## 2022-03-01 NOTE — Progress Notes (Signed)
Patient scheduled in error. Needing appt for her daughter. Mark erroneous

## 2022-06-02 ENCOUNTER — Encounter (HOSPITAL_COMMUNITY): Payer: Self-pay

## 2022-06-02 ENCOUNTER — Emergency Department (HOSPITAL_COMMUNITY): Payer: Self-pay

## 2022-06-02 ENCOUNTER — Emergency Department (HOSPITAL_COMMUNITY): Payer: Medicaid Other

## 2022-06-02 ENCOUNTER — Emergency Department (HOSPITAL_COMMUNITY)
Admission: EM | Admit: 2022-06-02 | Discharge: 2022-06-03 | Disposition: A | Discharge status: Home / Self Care | Payer: Self-pay | Attending: Emergency Medicine | Admitting: Emergency Medicine

## 2022-06-02 ENCOUNTER — Other Ambulatory Visit: Payer: Self-pay

## 2022-06-02 DIAGNOSIS — M25561 Pain in right knee: Secondary | ICD-10-CM | POA: Diagnosis not present

## 2022-06-02 DIAGNOSIS — R930 Abnormal findings on diagnostic imaging of skull and head, not elsewhere classified: Secondary | ICD-10-CM | POA: Insufficient documentation

## 2022-06-02 DIAGNOSIS — Y92410 Unspecified street and highway as the place of occurrence of the external cause: Secondary | ICD-10-CM | POA: Insufficient documentation

## 2022-06-02 DIAGNOSIS — M25562 Pain in left knee: Secondary | ICD-10-CM | POA: Insufficient documentation

## 2022-06-02 LAB — COMPREHENSIVE METABOLIC PANEL
ALT: 16 U/L (ref 0–44)
AST: 19 U/L (ref 15–41)
Albumin: 4.2 g/dL (ref 3.5–5.0)
Alkaline Phosphatase: 58 U/L (ref 38–126)
Anion gap: 10 (ref 5–15)
BUN: 8 mg/dL (ref 6–20)
CO2: 21 mmol/L — ABNORMAL LOW (ref 22–32)
Calcium: 9.4 mg/dL (ref 8.9–10.3)
Chloride: 105 mmol/L (ref 98–111)
Creatinine, Ser: 0.64 mg/dL (ref 0.44–1.00)
GFR, Estimated: >60 mL/min (ref >60)
Glucose, Bld: 72 mg/dL (ref 70–99)
Potassium: 3.6 mmol/L (ref 3.5–5.1)
Sodium: 136 mmol/L (ref 135–145)
Total Bilirubin: 0.8 mg/dL (ref 0.3–1.2)
Total Protein: 7.6 g/dL (ref 6.5–8.1)

## 2022-06-02 LAB — CBC
HCT: 33.8 % — ABNORMAL LOW (ref 36.0–46.0)
Hemoglobin: 11.8 g/dL — ABNORMAL LOW (ref 12.0–15.0)
MCH: 31.8 pg (ref 26.0–34.0)
MCHC: 34.9 g/dL (ref 30.0–36.0)
MCV: 91.1 fL (ref 80.0–100.0)
Platelets: 238 10*3/uL (ref 150–400)
RBC: 3.71 MIL/uL — ABNORMAL LOW (ref 3.87–5.11)
RDW: 12.4 % (ref 11.5–15.5)
WBC: 7 10*3/uL (ref 4.0–10.5)
nRBC: 0 % (ref 0.0–0.2)

## 2022-06-02 LAB — URINALYSIS, ROUTINE W REFLEX MICROSCOPIC
Bilirubin Urine: NEGATIVE
Glucose, UA: NEGATIVE mg/dL
Ketones, ur: 20 mg/dL — AB
Leukocytes,Ua: NEGATIVE
Nitrite: NEGATIVE
Protein, ur: NEGATIVE mg/dL
Specific Gravity, Urine: 1.016 (ref 1.005–1.030)
pH: 6 (ref 5.0–8.0)

## 2022-06-02 LAB — LACTIC ACID, PLASMA: Lactic Acid, Venous: 0.9 mmol/L (ref 0.5–1.9)

## 2022-06-02 LAB — I-STAT CHEM 8, ED
BUN: 7 mg/dL (ref 6–20)
Calcium, Ion: 1.23 mmol/L (ref 1.15–1.40)
Chloride: 104 mmol/L (ref 98–111)
Creatinine, Ser: 0.5 mg/dL (ref 0.44–1.00)
Glucose, Bld: 70 mg/dL (ref 70–99)
HCT: 37 % (ref 36.0–46.0)
Hemoglobin: 12.6 g/dL (ref 12.0–15.0)
Potassium: 3.6 mmol/L (ref 3.5–5.1)
Sodium: 138 mmol/L (ref 135–145)
TCO2: 21 mmol/L — ABNORMAL LOW (ref 22–32)

## 2022-06-02 LAB — I-STAT BETA HCG BLOOD, ED (MC, WL, AP ONLY): I-stat hCG, quantitative: <5 m[IU]/mL (ref <5)

## 2022-06-02 LAB — ETHANOL: Alcohol, Ethyl (B): <10 mg/dL (ref <10)

## 2022-06-02 MED ORDER — KETOROLAC TROMETHAMINE 15 MG/ML IJ SOLN
15.0000 mg | Freq: Once | INTRAMUSCULAR | Status: AC
  Administered 2022-06-02: 15 mg via INTRAVENOUS
  Filled 2022-06-02: qty 1

## 2022-06-02 MED ORDER — IOHEXOL 300 MG/ML  SOLN
100.0000 mL | Freq: Once | INTRAMUSCULAR | Status: AC | PRN
  Administered 2022-06-02: 100 mL via INTRAVENOUS

## 2022-06-02 MED ORDER — CYCLOBENZAPRINE HCL 10 MG PO TABS: 10.0000 mg | ORAL_TABLET | Freq: Two times a day (BID) | ORAL | 0 refills | Status: DC | PRN

## 2022-06-02 MED ORDER — FENTANYL CITRATE PF 50 MCG/ML IJ SOSY
50.0000 ug | PREFILLED_SYRINGE | Freq: Once | INTRAMUSCULAR | Status: AC
  Administered 2022-06-02: 50 ug via INTRAVENOUS
  Filled 2022-06-02: qty 1

## 2022-06-02 NOTE — Discharge Instructions (Addendum)
Use Tylenol/Motrin as needed for pain.  Additionally I prescribed Flexeril, which is a muscle relaxer.  Follow-up with your primary doctor.  Return to the ED as needed or for worsening of your symptoms.

## 2022-06-02 NOTE — ED Notes (Signed)
Patient back from CT.
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
## 2022-06-02 NOTE — ED Notes (Signed)
Patient to MRI.

## 2022-06-02 NOTE — ED Notes (Signed)
Patient currently awaiting MRI.
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
## 2022-06-02 NOTE — ED Notes (Signed)
Patient to CT.

## 2022-06-30 ENCOUNTER — Ambulatory Visit (HOSPITAL_COMMUNITY)
Admission: RE | Admit: 2022-06-30 | Discharge: 2022-06-30 | Disposition: A | Discharge status: Home / Self Care | Payer: No Typology Code available for payment source | Source: Ambulatory Visit | Attending: Internal Medicine | Admitting: Internal Medicine

## 2022-06-30 ENCOUNTER — Encounter (HOSPITAL_COMMUNITY): Payer: Self-pay

## 2022-06-30 VITALS — BP 106/69 | HR 76 | Temp 98.1°F | Resp 18

## 2022-06-30 DIAGNOSIS — N898 Other specified noninflammatory disorders of vagina: Secondary | ICD-10-CM | POA: Insufficient documentation

## 2022-06-30 DIAGNOSIS — N3000 Acute cystitis without hematuria: Secondary | ICD-10-CM

## 2022-06-30 LAB — POCT URINALYSIS DIPSTICK, ED / UC
Bilirubin Urine: NEGATIVE
Glucose, UA: NEGATIVE mg/dL
Ketones, ur: NEGATIVE mg/dL
Nitrite: POSITIVE — AB
Protein, ur: NEGATIVE mg/dL
Specific Gravity, Urine: 1.025 (ref 1.005–1.030)
Urobilinogen, UA: 1 mg/dL (ref 0.0–1.0)
pH: 6 (ref 5.0–8.0)

## 2022-06-30 LAB — POC URINE PREG, ED: Preg Test, Ur: NEGATIVE

## 2022-06-30 MED ORDER — CEPHALEXIN 500 MG PO CAPS: 500.0000 mg | ORAL_CAPSULE | Freq: Two times a day (BID) | ORAL | 0 refills | Status: AC

## 2022-06-30 NOTE — ED Provider Notes (Signed)
[place]    CSN: [id number] Arrival date & time: 06/30/22  1500      History   Chief Complaint Chief Complaint  Patient presents with   Vaginal Discharge    HPI Ann Santiago is a 31 y.o. female.   Patient presents to urgent care for evaluation of white/green vaginal discharge that she first noticed yesterday with associated vaginal odor.  No vaginal itching or vaginal rash.  No recent new sexual partners reported.  Denies changes to personal hygiene products.  Also reporting urinary frequency, urgency, and dysuria for the last week.  She does not have frequent urinary tract infections and states that the last time she had 1 was 2 to 3 years ago due to E. coli.  No recent antibiotic use.  No nausea, vomiting, diarrhea, abdominal pain, new low back pain, fever/chills, or dizziness.  No recent excessive intake of urinary irritants. She has not attempted use of any over the counter medications prior to arrival at urgent care.    Vaginal Discharge   Past Medical History:  Diagnosis Date   Anemia    Headache    History of chlamydia    Preterm labor 02/01/2017   Vaginal Pap smear, abnormal     Patient Active Problem List   Diagnosis Date Noted   Normal vaginal delivery 02/04/2017   Preterm labor 02/01/2017   Normal labor 02/01/2017   Late prenatal care, antepartum 11/22/2016    Past Surgical History:  Procedure Laterality Date   TUBAL LIGATION Bilateral 02/02/2017   Procedure: POST PARTUM TUBAL LIGATION;  Surgeon: Aletha Halim, MD;  Location: [facility];  Service: Gynecology;  Laterality: Bilateral;   WISDOM TOOTH EXTRACTION      OB History     Gravida  4   Para  4   Term  3   Preterm  1   AB      Living  4      SAB      IAB      Ectopic      Multiple  0   Live Births  4            Home Medications    Prior to Admission medications   Medication Sig Start Date End Date Taking? Authorizing Provider  cephALEXin (KEFLEX)  500 MG capsule Take 1 capsule (500 mg total) by mouth 2 (two) times daily for 7 days. 06/30/22 07/07/22 Yes Maedell Hedger, Stasia Cavalier, FNP  cyclobenzaprine (FLEXERIL) 10 MG tablet Take 1 tablet (10 mg total) by mouth 2 (two) times daily as needed for muscle spasms. 06/02/22   Rosine Abe, MD  fluticasone (FLONASE) 50 MCG/ACT nasal spray Place 1 spray into both nostrils daily. Patient not taking: Reported on 03/25/2020 10/20/19   Sharion Balloon, NP  ibuprofen (ADVIL) 200 MG tablet Take 200 mg by mouth every 6 (six) hours as needed. Patient not taking: Reported on 03/25/2020    [provider]  rizatriptan (MAXALT) 5 MG tablet Take 1 tablet (5 mg total) by mouth as needed for migraine. May repeat in 2 hours if needed Patient not taking: Reported on 03/05/2019 10/03/18 06/11/19  Wardell Honour, MD    Family History Family History  Problem Relation Age of Onset   Hypertension Maternal Grandmother     Social History Social History   Tobacco Use   Smoking status: Never   Smokeless tobacco: Never  Vaping Use   Vaping Use: Never used  Substance Use  Topics   Alcohol use: Yes    Comment: social   Drug use: No     Allergies   Patient has no known allergies.   Review of Systems Review of Systems  Genitourinary:  Positive for vaginal discharge.  Per HPI   Physical Exam Triage Vital Signs ED Triage Vitals [06/30/22 1535]  Enc Vitals Group     BP 106/69     Pulse Rate 76     Resp 18     Temp 98.1 F (36.7 C)     Temp Source Oral     SpO2 100 %     Weight      Height      Head Circumference      Peak Flow      Pain Score 0     Pain Loc      Pain Edu?      Excl. in [place]?    No data found.  Updated Vital Signs BP 106/69 (BP Location: Right Arm)   Pulse 76   Temp 98.1 F (36.7 C) (Oral)   Resp 18   LMP 05/11/2022 (Approximate)   SpO2 100%   Visual Acuity Right Eye Distance:   Left Eye Distance:   Bilateral Distance:    Right Eye Near:   Left Eye Near:    Bilateral  Near:     Physical Exam Vitals and nursing note reviewed.  Constitutional:      Appearance: She is not ill-appearing or toxic-appearing.  HENT:     Head: Normocephalic and atraumatic.     Right Ear: Hearing, tympanic membrane, ear canal and external ear normal.     Left Ear: Hearing, tympanic membrane, ear canal and external ear normal.     Nose: Nose normal.     Mouth/Throat:     Lips: Pink.     Mouth: Mucous membranes are moist.     Pharynx: No posterior oropharyngeal erythema.  Eyes:     General: Lids are normal. Vision grossly intact. Gaze aligned appropriately.     Extraocular Movements: Extraocular movements intact.     Conjunctiva/sclera: Conjunctivae normal.  Cardiovascular:     Rate and Rhythm: Normal rate and regular rhythm.     Heart sounds: Normal heart sounds, S1 normal and S2 normal.  Pulmonary:     Effort: Pulmonary effort is normal. No respiratory distress.     Breath sounds: Normal breath sounds and air entry.  Abdominal:     General: Bowel sounds are normal.     Palpations: Abdomen is soft.     Tenderness: There is no abdominal tenderness. There is no right CVA tenderness, left CVA tenderness or guarding.  Musculoskeletal:     Cervical back: Neck supple.  Skin:    General: Skin is warm and dry.     Capillary Refill: Capillary refill takes less than 2 seconds.     Findings: No rash.  Neurological:     General: No focal deficit present.     Mental Status: She is alert and oriented to person, place, and time. Mental status is at baseline.     Cranial Nerves: No dysarthria or facial asymmetry.  Psychiatric:        Mood and Affect: Mood normal.        Speech: Speech normal.        Behavior: Behavior normal.        Thought Content: Thought content normal.        Judgment: Judgment normal.  UC Treatments / Results  Labs (all labs ordered are listed, but only abnormal results are displayed) Labs Reviewed  POCT URINALYSIS DIPSTICK, ED / UC - Abnormal;  Notable for the following components:      Result Value   Hgb urine dipstick MODERATE (*)    Nitrite POSITIVE (*)    Leukocytes,Ua TRACE (*)    All other components within normal limits  POC URINE PREG, ED  CERVICOVAGINAL ANCILLARY ONLY    EKG   Radiology No results found.  Procedures Procedures (including critical care time)  Medications Ordered in UC Medications - No data to display  Initial Impression / Assessment and Plan / UC Course  I have reviewed the triage vital signs and the nursing notes.  Pertinent labs & imaging results that were available during my care of the patient were reviewed by me and considered in my medical decision making (see chart for details).   1. Acute cystitis Urinalysis shows trace leukocytes, moderate hemoglobin, and is nitrite positive consistent with acute uncomplicated cystitis. Keflex sent to pharmacy to be taken 2 times daily for the next 7 days. Urine culture sent.   2. Vaginal discharge STI labs pending.  Patient declines HIV and syphilis testing today.  Will notify patient of positive results and treat accordingly when labs come back.  Patient to avoid sexual intercourse until screening testing comes back.  Education provided regarding safe sexual practices and patient encouraged to use protection to prevent spread of STIs.   Discussed physical exam and available lab work findings in clinic with patient.  Counseled patient regarding appropriate use of medications and potential side effects for all medications recommended or prescribed today. Discussed red flag signs and symptoms of worsening condition,when to call the PCP office, return to urgent care, and when to seek higher level of care in the emergency department. Patient verbalizes understanding and agreement with plan. All questions answered. Patient discharged in stable condition.     Final Clinical Impressions(s) / UC Diagnoses   Final diagnoses:  Acute cystitis without  hematuria  Vaginal discharge     Discharge Instructions      You have been diagnosed with urinary tract infection today.  I have prescribed Keflex for you to take 500 mg twice daily for the next 7 days.  If you develop diarrhea while taking this medication you may purchase an over-the-counter probiotic or eat yogurt with live active cultures.  To avoid GI upset please take this medication with food. I have sent your urine for culture to see what type of bacteria grows. We will call you if we need to change the treatment plan based on the results of your urine culture.  Your STD testing has been sent to the lab and will come back in the next 2 to 3 days.  We will call you if any of your results are positive requiring treatment and treat you at that time.   Avoid sexual intercourse until your STD results come back.   If you develop any new or worsening symptoms or do not improve in the next 2 to 3 days, please return.  If your symptoms are severe, please go to the emergency room.  Follow-up with your primary care provider for further evaluation and management of your symptoms as well as ongoing wellness visits.  I hope you feel better!    ED Prescriptions     Medication Sig Dispense Auth. Provider   cephALEXin (KEFLEX) 500 MG capsule Take 1 capsule (500  mg total) by mouth 2 (two) times daily for 7 days. 14 capsule Talbot Grumbling, FNP      PDMP not reviewed this encounter.   Talbot Grumbling,  06/30/22 1758

## 2022-06-30 NOTE — ED Triage Notes (Signed)
Pt reports a vaginal discharge with odor x 1 day and malordorous urine x 1 week. States she has an ecoli infection before.

## 2022-06-30 NOTE — Discharge Instructions (Addendum)
You have been diagnosed with urinary tract infection today.  I have prescribed Keflex for you to take 500 mg twice daily for the next 7 days.  If you develop diarrhea while taking this medication you may purchase an over-the-counter probiotic or eat yogurt with live active cultures.  To avoid GI upset please take this medication with food. I have sent your urine for culture to see what type of bacteria grows. We will call you if we need to change the treatment plan based on the results of your urine culture.  Your STD testing has been sent to the lab and will come back in the next 2 to 3 days.  We will call you if any of your results are positive requiring treatment and treat you at that time.   Avoid sexual intercourse until your STD results come back.   If you develop any new or worsening symptoms or do not improve in the next 2 to 3 days, please return.  If your symptoms are severe, please go to the emergency room.  Follow-up with your primary care provider for further evaluation and management of your symptoms as well as ongoing wellness visits.  I hope you feel better!

## 2022-07-03 ENCOUNTER — Telehealth (HOSPITAL_COMMUNITY): Payer: Self-pay | Admitting: Emergency Medicine

## 2022-07-03 LAB — CERVICOVAGINAL ANCILLARY ONLY
Bacterial Vaginitis (gardnerella): NEGATIVE
Candida Glabrata: NEGATIVE
Candida Vaginitis: NEGATIVE
Chlamydia: NEGATIVE
Comment: NEGATIVE
Comment: NEGATIVE
Comment: NEGATIVE
Comment: NEGATIVE
Comment: NEGATIVE
Comment: NORMAL
Neisseria Gonorrhea: NEGATIVE
Trichomonas: POSITIVE — AB

## 2022-07-03 MED ORDER — METRONIDAZOLE 500 MG PO TABS: 500.0000 mg | ORAL_TABLET | Freq: Two times a day (BID) | ORAL | 0 refills | Status: DC

## 2022-08-10 ENCOUNTER — Ambulatory Visit (HOSPITAL_COMMUNITY): Payer: Self-pay

## 2022-09-01 DIAGNOSIS — Z419 Encounter for procedure for purposes other than remedying health state, unspecified: Secondary | ICD-10-CM | POA: Diagnosis not present

## 2022-09-05 ENCOUNTER — Encounter: Payer: Self-pay | Admitting: Family

## 2022-09-05 ENCOUNTER — Ambulatory Visit (INDEPENDENT_AMBULATORY_CARE_PROVIDER_SITE_OTHER): Payer: BC Managed Care – PPO | Admitting: Family

## 2022-09-05 VITALS — BP 109/75 | HR 72 | Temp 98.4°F | Ht 61.0 in | Wt 113.0 lb

## 2022-09-05 DIAGNOSIS — R42 Dizziness and giddiness: Secondary | ICD-10-CM | POA: Diagnosis not present

## 2022-09-05 DIAGNOSIS — F32A Depression, unspecified: Secondary | ICD-10-CM | POA: Insufficient documentation

## 2022-09-05 DIAGNOSIS — F411 Generalized anxiety disorder: Secondary | ICD-10-CM | POA: Diagnosis not present

## 2022-09-05 DIAGNOSIS — G43109 Migraine with aura, not intractable, without status migrainosus: Secondary | ICD-10-CM | POA: Diagnosis not present

## 2022-09-05 HISTORY — DX: Depression, unspecified: F32.A

## 2022-09-05 LAB — CBC WITH DIFFERENTIAL/PLATELET
Basophils Absolute: 0 10*3/uL (ref 0.0–0.1)
Basophils Relative: 0.7 % (ref 0.0–3.0)
Eosinophils Absolute: 0.3 10*3/uL (ref 0.0–0.7)
Eosinophils Relative: 6.1 % — ABNORMAL HIGH (ref 0.0–5.0)
HCT: 35.6 % — ABNORMAL LOW (ref 36.0–46.0)
Hemoglobin: 12.2 g/dL (ref 12.0–15.0)
Lymphocytes Relative: 26 % (ref 12.0–46.0)
Lymphs Abs: 1.1 10*3/uL (ref 0.7–4.0)
MCHC: 34.2 g/dL (ref 30.0–36.0)
MCV: 93.1 fl (ref 78.0–100.0)
Monocytes Absolute: 0.3 10*3/uL (ref 0.1–1.0)
Monocytes Relative: 7.1 % (ref 3.0–12.0)
Neutro Abs: 2.5 10*3/uL (ref 1.4–7.7)
Neutrophils Relative %: 60.1 % (ref 43.0–77.0)
Platelets: 226 10*3/uL (ref 150.0–400.0)
RBC: 3.83 Mil/uL — ABNORMAL LOW (ref 3.87–5.11)
RDW: 13.4 % (ref 11.5–15.5)
WBC: 4.2 10*3/uL (ref 4.0–10.5)

## 2022-09-05 LAB — COMPREHENSIVE METABOLIC PANEL
ALT: 13 U/L (ref 0–35)
AST: 17 U/L (ref 0–37)
Albumin: 4.7 g/dL (ref 3.5–5.2)
Alkaline Phosphatase: 64 U/L (ref 39–117)
BUN: 11 mg/dL (ref 6–23)
CO2: 27 mEq/L (ref 19–32)
Calcium: 9.8 mg/dL (ref 8.4–10.5)
Chloride: 106 mEq/L (ref 96–112)
Creatinine, Ser: 0.6 mg/dL (ref 0.40–1.20)
GFR: 119.62 mL/min (ref >60.00)
Glucose, Bld: 86 mg/dL (ref 70–99)
Potassium: 4.8 mEq/L (ref 3.5–5.1)
Sodium: 141 mEq/L (ref 135–145)
Total Bilirubin: 0.5 mg/dL (ref 0.2–1.2)
Total Protein: 7.6 g/dL (ref 6.0–8.3)

## 2022-09-05 MED ORDER — EMGALITY 120 MG/ML ~~LOC~~ SOAJ: 120.0000 mg | SUBCUTANEOUS | 5 refills | Status: DC

## 2022-09-05 MED ORDER — FLUOXETINE HCL 10 MG PO CAPS: 10.0000 mg | ORAL_CAPSULE | Freq: Every day | ORAL | 2 refills | Status: DC

## 2022-09-05 MED ORDER — HYDROXYZINE HCL 10 MG PO TABS: 10.0000 mg | ORAL_TABLET | Freq: Three times a day (TID) | ORAL | 2 refills | Status: DC | PRN

## 2022-09-05 MED ORDER — KETOROLAC TROMETHAMINE 60 MG/2ML IM SOLN
60.0000 mg | Freq: Once | INTRAMUSCULAR | Status: AC
  Administered 2022-09-05: 60 mg via INTRAMUSCULAR

## 2022-09-05 NOTE — Assessment & Plan Note (Signed)
chronic - unstable pt has been making her last [place] RX last and only taking 1 per day and taking a lot of tylenol Nurtec not strong enough, no prophylactic tried in past sending Imitrex and Naproxen, advised on use & SE given Toradol shot in office, given Emgality sample w/2 injections, advised to give 1 mo apart, advised on use & SE f/u 1 mo

## 2022-09-05 NOTE — Progress Notes (Signed)
New Patient Office Visit  Subjective:  Patient ID: Ann Santiago, female    DOB: [birth year]  Age: 31 y.o. MRN: [id number]  CC:  Chief Complaint  Patient presents with  . New Patient (Initial Visit)  . Fatigue    Pt c/o fatigue and Numbness in left hand for about 2-3 months.  Pt states she passed out on Friday randomly, first time occurred. Fasting and would like labs.   . Migraine  . Anxiety    HPI Ann Santiago presents for establishing care today.  Syncopal episode/dizziness:  felt dizzy when she stood up, and then walked passed out, admits to not eating for a full day sometimes, also does not hydrate.  Hx of Anemia:  reports having after one of her pregnancies, has hx of ice & starch cravings, but none today, just having more fatigue lately. Did take iron supplement in past.   Migraine:  w/dizziness, denies nausea, has vision changes, flashing spots, stress w/4 kids is a trigger, has seen Neurology in past, but didn't go back for testing. Reports [employer] working, would take 1 and fall asleep and then take another pill and be ok. Nurtec did not work well. Imitrex worked but she was taking too many. Denies ever taking Elavil, Topamax, Emgality, Ajovy, or Metoprolol.  Anxiety:  gets very nervous, very emotional, starts crying when she gets home from work, feels overwhelmed, taking care of 4 kids, has fiance but he travels, has mom who helps a lot. Denies depression, states she has no reason to be depressed, has never taken any meds in past or had therapy.  Assessment & Plan:   Problem List Items Addressed This Visit       Cardiovascular and Mediastinum   Migraine with aura and without status migrainosus, not intractable - Primary    chronic - unstable pt has been making her last [employer]elyn Meier RX last and only taking 1 per day and taking a lot of tylenol Nurtec not strong enough, no prophylactic tried in past sending Imitrex and Naproxen, advised on use & SE given Toradol shot in  office, given Emgality sample w/2 injections, advised to give 1 mo apart, advised on use & SE f/u 1 mo      Relevant Medications   FLUoxetine (PROZAC) 10 MG capsule   Galcanezumab-gnlm (EMGALITY) 120 MG/ML SOAJ     Other   Generalized anxiety disorder    chronic - Unstable - high GAD score no hx of meds other than Xanax or counseling discussed benefits of both, and different meds sending Prozac 6m qd, and Hydroxyzine to use prn until Prozac kicks in - advised on use & SE sending psych referral f/u 1 mo      Relevant Medications   FLUoxetine (PROZAC) 10 MG capsule   hydrOXYzine (ATARAX) 10 MG tablet   Other Relevant Orders   Ambulatory referral to Psychiatry   Other Visit Diagnoses     Dizziness    - believe mostly r/t pt migraines, and going long periods w/o eatting   Relevant Orders   CBC with Differential/Platelet   Comp Met (CMET)       Subjective:    Outpatient Medications Prior to Visit  Medication Sig Dispense Refill  . cyclobenzaprine (FLEXERIL) 10 MG tablet Take 1 tablet (10 mg total) by mouth 2 (two) times daily as needed for muscle spasms. 20 tablet 0  . fluticasone (FLONASE) 50 MCG/ACT nasal spray Place 1 spray into both nostrils daily. 16 g 0  .  ibuprofen (ADVIL) 200 MG tablet Take 200 mg by mouth every 6 (six) hours as needed.    . metroNIDAZOLE (FLAGYL) 500 MG tablet Take 1 tablet (500 mg total) by mouth 2 (two) times daily. (Patient not taking: Reported on 09/05/2022) 14 tablet 0   No facility-administered medications prior to visit.   Past Medical History:  Diagnosis Date  . Allergy   . Anemia   . Anxiety   . Headache   . History of chlamydia   . Normal labor 02/01/2017  . Normal vaginal delivery 02/04/2017  . Preterm labor 02/01/2017  . Vaginal Pap smear, abnormal    Past Surgical History:  Procedure Laterality Date  . TUBAL LIGATION Bilateral 02/02/2017   Procedure: POST PARTUM TUBAL LIGATION;  Surgeon: Aletha Halim, MD;  Location: [facility];  Service: Gynecology;  Laterality: Bilateral;  . WISDOM TOOTH EXTRACTION      Objective:   Today's Vitals: BP 109/75 (BP Location: Left Arm, Patient Position: Sitting, Cuff Size: Large)   Pulse 72   Temp 98.4 F (36.9 C) (Temporal)   Ht _0  (1.549 m)   Wt 113 lb (51.3 kg)   LMP 09/04/2022 (Exact Date)   SpO2 100%   BMI 21.35 kg/m   Physical Exam Vitals and nursing note reviewed.  Constitutional:      Appearance: Normal appearance.  Cardiovascular:     Rate and Rhythm: Normal rate and regular rhythm.  Pulmonary:     Effort: Pulmonary effort is normal.     Breath sounds: Normal breath sounds.  Musculoskeletal:        General: Normal range of motion.  Skin:    General: Skin is warm and dry.  Neurological:     Mental Status: She is alert.  Psychiatric:        Mood and Affect: Mood normal.        Behavior: Behavior normal.    Meds ordered this encounter  Medications  . ketorolac (TORADOL) injection 60 mg  . FLUoxetine (PROZAC) 10 MG capsule    Sig: Take 1 capsule (10 mg total) by mouth daily.    Dispense:  30 capsule    Refill:  2    Order Specific Question:   Supervising Provider    Answer:   ANDY, CAMILLE L [[id number]]  . hydrOXYzine (ATARAX) 10 MG tablet    Sig: Take 1 tablet (10 mg total) by mouth 3 (three) times daily as needed for anxiety.    Dispense:  30 tablet    Refill:  2    Order Specific Question:   Supervising Provider    Answer:   ANDY, CAMILLE L [[id number]]  . Galcanezumab-gnlm (EMGALITY) 120 MG/ML SOAJ    Sig: Inject 120 mg into the skin every 30 (thirty) days.    Dispense:  1.12 mL    Refill:  5    pt received 2 mos sample    Order Specific Question:   Supervising Provider    Answer:   ANDY, CAMILLE L [[id number]]    Jeanie Sewer, NP

## 2022-09-05 NOTE — Patient Instructions (Addendum)
Welcome to [facility], It was a pleasure meeting you today!    I will review your lab results via MyChart in a few days.  We gave you a Toradol injection today to help relieve your migraine.  I also gave you Emgality injectable to try to PREVENT your migraine. Use  1 shot each month, I will send to your pharmacy to see if covered. I have sent generic Imitrex and Naproxen to your pharmacy to take when your FIRST feel a migraine starting, and then you can repeat the Imitrex dose in 2 hours, with a max of 4 pills in 24 hours.  Hopefully, the Emgality will keep you from using too much of the Imitrex.   I am also send generic Prozac to start for you Anxiety. This is a low dose medication and it is safe to take with all the above. I am also sending a referral for therapy, they will call you directly to schedule.  Please schedule a 1 month follow up visit today.     PLEASE NOTE: If you had any LAB tests please let us know if you have not heard back within a few days. You may see your results on MyChart before we have a chance to review them but we will give you a call once they are reviewed by [place]. If we ordered any REFERRALS today, please let us know if you have not heard from their office within the next week.  Let us know through MyChart if you are needing REFILLS, or have your pharmacy send [place] the request. You can also use MyChart to communicate with me or any office staff.

## 2022-09-05 NOTE — Assessment & Plan Note (Signed)
chronic - Unstable - high GAD score no hx of meds other than Xanax or counseling discussed benefits of both, and different meds sending Prozac 10mg  qd, and Hydroxyzine to use prn until Prozac kicks in - advised on use & SE sending psych referral f/u 1 mo

## 2022-09-06 ENCOUNTER — Encounter: Payer: Self-pay | Admitting: Family

## 2022-09-07 NOTE — Progress Notes (Signed)
Hi Ann Santiago,  Your Glucose (sugar), electrolytes, kidney and liver function, & blood count are all in good range.  There is no anemia or other abnormality seen.  Take care!

## 2022-09-14 ENCOUNTER — Encounter: Payer: Self-pay | Admitting: *Deleted

## 2022-09-20 ENCOUNTER — Ambulatory Visit: Payer: Medicaid Other | Admitting: Obstetrics

## 2022-10-02 DIAGNOSIS — Z419 Encounter for procedure for purposes other than remedying health state, unspecified: Secondary | ICD-10-CM | POA: Diagnosis not present

## 2022-10-04 ENCOUNTER — Ambulatory Visit: Payer: BC Managed Care – PPO | Admitting: Family

## 2022-10-06 ENCOUNTER — Encounter: Payer: Self-pay | Admitting: Family

## 2022-10-06 ENCOUNTER — Ambulatory Visit (INDEPENDENT_AMBULATORY_CARE_PROVIDER_SITE_OTHER): Payer: BC Managed Care – PPO | Admitting: Family

## 2022-10-06 VITALS — BP 107/69 | HR 78 | Temp 98.2°F | Ht 61.0 in | Wt 117.2 lb

## 2022-10-06 DIAGNOSIS — F411 Generalized anxiety disorder: Secondary | ICD-10-CM

## 2022-10-06 DIAGNOSIS — G43109 Migraine with aura, not intractable, without status migrainosus: Secondary | ICD-10-CM

## 2022-10-06 DIAGNOSIS — N920 Excessive and frequent menstruation with regular cycle: Secondary | ICD-10-CM

## 2022-10-06 MED ORDER — LO LOESTRIN FE 1 MG-10 MCG / 10 MCG PO TABS: 1.0000 | ORAL_TABLET | Freq: Every day | ORAL | 11 refills | Status: DC

## 2022-10-06 MED ORDER — CYCLOBENZAPRINE HCL 5 MG PO TABS: 5.0000 mg | ORAL_TABLET | Freq: Three times a day (TID) | ORAL | 2 refills | Status: DC | PRN

## 2022-10-06 MED ORDER — EMGALITY 120 MG/ML ~~LOC~~ SOAJ: 120.0000 mg | SUBCUTANEOUS | 5 refills | Status: DC

## 2022-10-06 NOTE — Assessment & Plan Note (Signed)
New heavy bleeding w/clots hx tubal ligation starting Lo Lestrin w/Fe, advised to start on first day of next cycle, advised on SE, missed pills could cause irregular bleeding f/u 1 month

## 2022-10-06 NOTE — Assessment & Plan Note (Addendum)
chronic - unstable pt reports Emgality working, but unable to get more from pharmacy has taken the Flexeril for pain with some relief but just makes her fall asleep resending [employer] and starting PA refilling lower dose Flexeril 5mg   f/u 1 mo

## 2022-10-06 NOTE — Assessment & Plan Note (Signed)
chronic, stable started Prozac 10mg  qd last visit, pt states it has started helping, tolerating, no SE, Hydroxyzine caused her to fall asleep - advised to cut in half & try if still needed sending psych referral again for [facility] therapist f/u 1 mo

## 2022-10-06 NOTE — Progress Notes (Signed)
Patient ID: Ann Santiago, female    DOB: [birth year], 32 y.o.   MRN: [id number]  Chief Complaint  Patient presents with   Migraine    Pt states she her migraines are getting worse, and she is also under a lot of stress. Pt states the emgality did help for 2 weeks.    Anxiety   Menstrual Problem    Pt states her cycles are very heavy and has seen blood clots.     HPI: Heavy menstrual bleeding:   reports regular cycles, normal length, but are very heavy, going through 8-10 pads, and maxi tampons, soaked thru to her clothes. Denies sx of anemia, check at last visit and wnl. pt not on any BC, hx of tubal ligation.  Migraine:  w/dizziness, denies nausea, has vision changes, flashing spots, stress w/4 kids is a trigger, has seen Neurology in past, but didn't go back for testing. Reports Roselyn Meier working, would take 1 and fall asleep and then take another pill and be ok. Nurtec did not work well. Imitrex worked but she was taking too many. Denies ever taking Elavil, Topamax, Emgality, Ajovy, or Metoprolol.  Anxiety:  gets very nervous, very emotional, starts crying when she gets home from work, feels overwhelmed, taking care of 4 kids, has fiance but he travels, has mom who helps a lot. Denies depression, states she has no reason to be depressed, has never taken any meds in past or had therapy.  Started on Prozac last visit and reports it seems to be helping, denies any SE.   Assessment & Plan:   Problem List Items Addressed This Visit       Cardiovascular and Mediastinum   Migraine with aura and without status migrainosus, not intractable - Primary    chronic - unstable pt reports Emgality working, but unable to get more from pharmacy has taken the Flexeril for pain with some relief but just makes her fall asleep resending [employer] and starting PA refilling lower dose Flexeril 5mg   f/u 1 mo      Relevant Medications   Galcanezumab-gnlm (EMGALITY) 120 MG/ML SOAJ   cyclobenzaprine  (FLEXERIL) 5 MG tablet     Other   Generalized anxiety disorder    chronic, stable started Prozac 10mg  qd last visit, pt states it has started helping, tolerating, no SE, Hydroxyzine caused her to fall asleep - advised to cut in half & try if still needed sending psych referral again for [facility] therapist f/u 1 mo      Relevant Orders   Ambulatory referral to Psychology   Menorrhagia with regular cycle    New heavy bleeding w/clots hx tubal ligation starting Lo Lestrin w/Fe, advised to start on first day of next cycle, advised on SE, missed pills could cause irregular bleeding f/u 1 month      Relevant Medications   Norethindrone-Ethinyl Estradiol-Fe Biphas (LO LOESTRIN FE) 1 MG-10 MCG / 10 MCG tablet    Subjective:    Outpatient Medications Prior to Visit  Medication Sig Dispense Refill   FLUoxetine (PROZAC) 10 MG capsule Take 1 capsule (10 mg total) by mouth daily. 30 capsule 2   hydrOXYzine (ATARAX) 10 MG tablet Take 1 tablet (10 mg total) by mouth 3 (three) times daily as needed for anxiety. 30 tablet 2   cyclobenzaprine (FLEXERIL) 10 MG tablet Take 1 tablet (10 mg total) by mouth 2 (two) times daily as needed for muscle spasms. 20 tablet 0   Galcanezumab-gnlm (EMGALITY) 120 MG/ML SOAJ  Inject 120 mg into the skin every 30 (thirty) days. 1.12 mL 5   No facility-administered medications prior to visit.   Past Medical History:  Diagnosis Date   Allergy    Anemia    Anxiety    Headache    History of chlamydia    Normal labor 02/01/2017   Normal vaginal delivery 02/04/2017   Preterm labor 02/01/2017   Vaginal Pap smear, abnormal    Past Surgical History:  Procedure Laterality Date   TUBAL LIGATION Bilateral 02/02/2017   Procedure: POST PARTUM TUBAL LIGATION;  Surgeon: Aletha Halim, MD;  Location: [facility];  Service: Gynecology;  Laterality: Bilateral;   WISDOM TOOTH EXTRACTION     No Known Allergies    Objective:    Physical Exam Vitals and nursing  note reviewed.  Constitutional:      Appearance: Normal appearance.  Cardiovascular:     Rate and Rhythm: Normal rate and regular rhythm.  Pulmonary:     Effort: Pulmonary effort is normal.     Breath sounds: Normal breath sounds.  Musculoskeletal:        General: Normal range of motion.  Skin:    General: Skin is warm and dry.  Neurological:     Mental Status: She is alert.  Psychiatric:        Mood and Affect: Mood normal.        Behavior: Behavior normal.    BP 107/69 (BP Location: Left Arm, Patient Position: Sitting, Cuff Size: Large)   Pulse 78   Temp 98.2 F (36.8 C) (Temporal)   Ht 5\' 1"  (1.549 m)   Wt 117 lb 4 oz (53.2 kg)   LMP 10/04/2022 (Exact Date)   SpO2 100%   BMI 22.15 kg/m  Wt Readings from Last 3 Encounters:  10/06/22 117 lb 4 oz (53.2 kg)  09/05/22 113 lb (51.3 kg)  06/02/22 115 lb 15.4 oz (52.6 kg)       Jeanie Sewer, NP

## 2022-10-09 ENCOUNTER — Telehealth: Payer: Self-pay

## 2022-10-09 NOTE — Telephone Encounter (Signed)
PA started on 10/06/2022 and APPROVED on 10/09/2022.  I called pt and LVM in regards.   Covermymeds : (Key: [id number])

## 2022-10-09 NOTE — Telephone Encounter (Signed)
-----   Message from Jeanie Sewer, NP sent at 10/06/2022  3:11 PM EST ----- Regarding: Emgality PA Please start PA - failed OTC meds - excedrine migraine, NSAIDs and Flexeril

## 2022-10-11 ENCOUNTER — Ambulatory Visit: Payer: Medicaid Other | Admitting: Obstetrics

## 2022-10-24 NOTE — Progress Notes (Signed)
This encounter was created in error - please disregard.

## 2022-11-02 DIAGNOSIS — Z419 Encounter for procedure for purposes other than remedying health state, unspecified: Secondary | ICD-10-CM | POA: Diagnosis not present

## 2022-11-03 ENCOUNTER — Ambulatory Visit: Payer: BC Managed Care – PPO | Admitting: Family

## 2022-11-16 ENCOUNTER — Telehealth: Payer: Self-pay | Admitting: Family

## 2022-11-16 NOTE — Telephone Encounter (Signed)
Pt states pharmacy does not have any more refills for rx. Pt has been scheduled for her late follow up appt. Please advise.  MED:  FLUoxetine (PROZAC) 10 MG capsule

## 2022-11-17 ENCOUNTER — Encounter: Payer: Self-pay | Admitting: Family

## 2022-11-17 ENCOUNTER — Ambulatory Visit (INDEPENDENT_AMBULATORY_CARE_PROVIDER_SITE_OTHER): Payer: BC Managed Care – PPO | Admitting: Family

## 2022-11-17 VITALS — BP 103/69 | HR 82 | Temp 97.6°F | Ht 61.0 in | Wt 115.4 lb

## 2022-11-17 DIAGNOSIS — N920 Excessive and frequent menstruation with regular cycle: Secondary | ICD-10-CM | POA: Diagnosis not present

## 2022-11-17 DIAGNOSIS — F411 Generalized anxiety disorder: Secondary | ICD-10-CM

## 2022-11-17 MED ORDER — FLUOXETINE HCL 20 MG PO CAPS: 20.0000 mg | ORAL_CAPSULE | Freq: Every day | ORAL | 2 refills | Status: DC

## 2022-11-17 MED ORDER — HYDROXYZINE HCL 10 MG PO TABS: 10.0000 mg | ORAL_TABLET | Freq: Three times a day (TID) | ORAL | 2 refills | Status: DC | PRN

## 2022-11-17 MED ORDER — TRANEXAMIC ACID 650 MG PO TABS: 650.0000 mg | ORAL_TABLET | Freq: Three times a day (TID) | ORAL | 2 refills | Status: DC

## 2022-11-17 NOTE — Assessment & Plan Note (Signed)
New heavy bleeding w/clots hx tubal ligation sent Lo Lestrin w/Fe, but copay $100 sending Tranexamic acid, advised on use & SE  f/u 3 months

## 2022-11-17 NOTE — Assessment & Plan Note (Signed)
chronic, unstable taking Prozac 56m, thought it was helping some at 3 week mark, but now feeling same as before she started increasing dose to 21mqd today reports she is also taking full 1044mf Hydroxyzine and not drowsy, taking daily, advised ok to take w/Prozac & again later if needed pt waiting to sch appt w/[facility] therapist f/u 3 mo

## 2022-11-17 NOTE — Progress Notes (Signed)
Patient ID: Ann Santiago, female    DOB: 1991-04-24, 32 y.o.   MRN: [id number]  Chief Complaint  Patient presents with   Migraine   Anxiety    F/u   Menstrual Problem    Pt states her BC was to high at the pharmacy.     HPI: Heavy menstrual bleeding:   reports regular cycles, normal length, but are very heavy, going through 8-10 pads, and maxi tampons, soaked thru to her clothes. Denies sx of anemia, check at last visit and wnl. pt not on any BC, hx of tubal ligation.  Anxiety:  gets very nervous, very emotional, starts crying when she gets home from work, feels overwhelmed, taking care of 4 kids, has fiance but he travels, has mom who helps a lot. Denies depression, states she has no reason to be depressed, has never taken any meds in past or had therapy.  Started on Prozac last visit and reports it seems to be helping, denies any SE.  Assessment & Plan:   Problem List Items Addressed This Visit       Other   Generalized anxiety disorder    chronic, unstable taking Prozac 12m, thought it was helping some at 3 week mark, but now feeling same as before she started increasing dose to 254mqd today reports she is also taking full 1054mf Hydroxyzine and not drowsy, taking daily, advised ok to take w/Prozac & again later if needed pt waiting to sch appt w/[facility] therapist f/u 3 mo      Relevant Medications   FLUoxetine (PROZAC) 20 MG capsule   hydrOXYzine (ATARAX) 10 MG tablet   Menorrhagia with regular cycle - Primary    New heavy bleeding w/clots hx tubal ligation sent Lo Lestrin w/Fe, but copay $100 sending Tranexamic acid, advised on use & SE  f/u 3 months      Relevant Medications   tranexamic acid (LYSTEDA) 650 MG TABS tablet    Subjective:    Outpatient Medications Prior to Visit  Medication Sig Dispense Refill   cyclobenzaprine (FLEXERIL) 5 MG tablet Take 1-2 tablets (5-10 mg total) by mouth 3 (three) times daily as needed for muscle spasms (migraines). 30  tablet 2   Galcanezumab-gnlm (EMGALITY) 120 MG/ML SOAJ Inject 120 mg into the skin every 30 (thirty) days. 1.12 mL 5   FLUoxetine (PROZAC) 10 MG capsule Take 1 capsule (10 mg total) by mouth daily. 30 capsule 2   hydrOXYzine (ATARAX) 10 MG tablet Take 1 tablet (10 mg total) by mouth 3 (three) times daily as needed for anxiety. 30 tablet 2   Norethindrone-Ethinyl Estradiol-Fe Biphas (LO LOESTRIN FE) 1 MG-10 MCG / 10 MCG tablet Take 1 tablet by mouth daily. Start first pack on first day of menstrual cycle. (Patient not taking: Reported on 11/17/2022) 28 tablet 11   No facility-administered medications prior to visit.   Past Medical History:  Diagnosis Date   Allergy    Anemia    Anxiety    Headache    History of chlamydia    Normal labor 02/01/2017   Normal vaginal delivery 02/04/2017   Preterm labor 02/01/2017   Vaginal Pap smear, abnormal    Past Surgical History:  Procedure Laterality Date   TUBAL LIGATION Bilateral 02/02/2017   Procedure: POST PARTUM TUBAL LIGATION;  Surgeon: PicAletha HalimD;  [facility];  Laterality: Bilateral;   WISDOM TOOTH EXTRACTION     No Known Allergies    Objective:  Physical Exam Vitals and nursing note reviewed.  Constitutional:      Appearance: Normal appearance.  Cardiovascular:     Rate and Rhythm: Normal rate and regular rhythm.  Pulmonary:     Effort: Pulmonary effort is normal.     Breath sounds: Normal breath sounds.  Musculoskeletal:        General: Normal range of motion.  Skin:    General: Skin is warm and dry.  Neurological:     Mental Status: She is alert.  Psychiatric:        Mood and Affect: Mood normal.        Behavior: Behavior normal.    BP 103/69 (BP Location: Left Arm, Patient Position: Sitting, Cuff Size: Large)   Pulse 82   Temp 97.6 F (36.4 C) (Temporal)   Ht 5' 1"$  (1.549 m)   Wt 115 lb 6 oz (52.3 kg)   LMP 10/10/2022 (Approximate)   SpO2 99%   BMI 21.80 kg/m  Wt  Readings from Last 3 Encounters:  11/17/22 115 lb 6 oz (52.3 kg)  10/06/22 117 lb 4 oz (53.2 kg)  09/05/22 113 lb (51.3 kg)       Jeanie Sewer, NP

## 2022-12-01 DIAGNOSIS — Z419 Encounter for procedure for purposes other than remedying health state, unspecified: Secondary | ICD-10-CM | POA: Diagnosis not present

## 2022-12-15 ENCOUNTER — Ambulatory Visit: Payer: BC Managed Care – PPO | Admitting: Obstetrics

## 2022-12-28 DIAGNOSIS — L700 Acne vulgaris: Secondary | ICD-10-CM | POA: Diagnosis not present

## 2023-01-01 DIAGNOSIS — Z419 Encounter for procedure for purposes other than remedying health state, unspecified: Secondary | ICD-10-CM | POA: Diagnosis not present

## 2023-01-05 ENCOUNTER — Ambulatory Visit (INDEPENDENT_AMBULATORY_CARE_PROVIDER_SITE_OTHER): Payer: BC Managed Care – PPO | Admitting: Obstetrics

## 2023-01-05 ENCOUNTER — Encounter: Payer: Self-pay | Admitting: Obstetrics

## 2023-01-05 ENCOUNTER — Ambulatory Visit (INDEPENDENT_AMBULATORY_CARE_PROVIDER_SITE_OTHER): Payer: BC Managed Care – PPO | Admitting: Licensed Clinical Social Worker

## 2023-01-05 ENCOUNTER — Other Ambulatory Visit (HOSPITAL_COMMUNITY)
Admission: RE | Admit: 2023-01-05 | Discharge: 2023-01-05 | Disposition: A | Discharge status: Home / Self Care | Payer: BC Managed Care – PPO | Source: Ambulatory Visit | Attending: Obstetrics | Admitting: Obstetrics

## 2023-01-05 VITALS — BP 114/77 | HR 64 | Ht 60.0 in | Wt 115.0 lb

## 2023-01-05 DIAGNOSIS — F419 Anxiety disorder, unspecified: Secondary | ICD-10-CM

## 2023-01-05 DIAGNOSIS — N939 Abnormal uterine and vaginal bleeding, unspecified: Secondary | ICD-10-CM | POA: Diagnosis not present

## 2023-01-05 DIAGNOSIS — Z113 Encounter for screening for infections with a predominantly sexual mode of transmission: Secondary | ICD-10-CM

## 2023-01-05 DIAGNOSIS — Z1339 Encounter for screening examination for other mental health and behavioral disorders: Secondary | ICD-10-CM | POA: Diagnosis not present

## 2023-01-05 DIAGNOSIS — Z01419 Encounter for gynecological examination (general) (routine) without abnormal findings: Secondary | ICD-10-CM

## 2023-01-05 DIAGNOSIS — N898 Other specified noninflammatory disorders of vagina: Secondary | ICD-10-CM

## 2023-01-05 DIAGNOSIS — G43109 Migraine with aura, not intractable, without status migrainosus: Secondary | ICD-10-CM

## 2023-01-05 DIAGNOSIS — Z9189 Other specified personal risk factors, not elsewhere classified: Secondary | ICD-10-CM | POA: Diagnosis not present

## 2023-01-05 DIAGNOSIS — F418 Other specified anxiety disorders: Secondary | ICD-10-CM

## 2023-01-05 NOTE — Progress Notes (Addendum)
32 y.o. GYN presents AEX/PAP/STD screening.  C/o grey vaginal discharge and odor x 2 weeks.  PHQ-9= 17    GAD-7= 13

## 2023-01-05 NOTE — Progress Notes (Signed)
Subjective:        Ann Santiago is a 32 y.o. female here for a routine exam.  Current complaints: Vaginal discharge.    Personal health questionnaire:  Is patient [name], have a family history of breast and/or ovarian cancer: no Is there a family history of uterine cancer diagnosed at age < 96, gastrointestinal cancer, urinary tract cancer, family member who is a Personnel officer syndrome-associated carrier: no Is the patient overweight and hypertensive, family history of diabetes, personal history of gestational diabetes, preeclampsia or PCOS: no Is patient over 33, have PCOS,  family history of premature CHD under age 54, diabetes, smoke, have hypertension or peripheral artery disease:  no At any time, has a partner hit, kicked or otherwise hurt or frightened you?: no Over the past 2 weeks, have you felt down, depressed or hopeless?: yes Over the past 2 weeks, have you felt little interest or pleasure in doing things?:no   Gynecologic History Patient's last menstrual period was 01/02/2023 (exact date). Contraception: tubal ligation Last Pap: 2021. Results were: normal Last mammogram: n/a. Results were: n/a  Obstetric History OB History  Gravida Para Term Preterm AB Living  4 4 3 1   4   SAB IAB Ectopic Multiple Live Births        0 4    # Outcome Date GA Lbr Len/2nd Weight Sex Delivery Anes PTL Lv  4 Preterm 02/02/17 [redacted]w[redacted]d 14:02 / 00:01 5 lb 13.8 oz (2.66 kg) M Vag-Spont None  LIV  3 Term 04/13/16 [redacted]w[redacted]d 05:56 / 00:16 6 lb 6.8 oz (2.914 kg) M Vag-Spont EPI  LIV  2 Term 05/05/12 [redacted]w[redacted]d 06:30 / 00:05 6 lb 8.4 oz (2.96 kg) F Vag-Spont None  LIV  1 Term 2011 [redacted]w[redacted]d  5 lb 11 oz (2.58 kg) F Vag-Spont   LIV    Past Medical History:  Diagnosis Date   Allergy    Anemia    Anxiety    Headache    History of chlamydia    Normal labor 02/01/2017   Normal vaginal delivery 02/04/2017   Preterm labor 02/01/2017   Vaginal Pap smear, abnormal     Past Surgical History:  Procedure  Laterality Date   TUBAL LIGATION Bilateral 02/02/2017   Procedure: POST PARTUM TUBAL LIGATION;  Surgeon: [place] Bing, MD;  [facility];  Service: Gynecology;  Laterality: Bilateral;   WISDOM TOOTH EXTRACTION       Current Outpatient Medications:    cyclobenzaprine (FLEXERIL) 5 MG tablet, Take 1-2 tablets (5-10 mg total) by mouth 3 (three) times daily as needed for muscle spasms (migraines)., Disp: 30 tablet, Rfl: 2   FLUoxetine (PROZAC) 20 MG capsule, Take 1 capsule (20 mg total) by mouth daily., Disp: 30 capsule, Rfl: 2   Galcanezumab-gnlm (EMGALITY) 120 MG/ML SOAJ, Inject 120 mg into the skin every 30 (thirty) days., Disp: 1.12 mL, Rfl: 5   hydrOXYzine (ATARAX) 10 MG tablet, Take 1 tablet (10 mg total) by mouth 3 (three) times daily as needed for anxiety., Disp: 60 tablet, Rfl: 2   tranexamic acid (LYSTEDA) 650 MG TABS tablet, Take 1-2 tablets (650-1,300 mg total) by mouth 3 (three) times daily. START on first day of menses, stop after 5 days or sooner if bleeding is less., Disp: 30 tablet, Rfl: 2 No Known Allergies  Social History   Tobacco Use   Smoking status: Never   Smokeless tobacco: Never  Substance Use Topics   Alcohol use: Yes    Alcohol/week: 1.0  standard drink of alcohol    Types: 1 Standard drinks or equivalent per week    Comment: social    Family History  Problem Relation Age of Onset   Hypertension Maternal Grandmother       Review of Systems  Constitutional: negative for fatigue and weight loss Respiratory: negative for cough and wheezing Cardiovascular: negative for chest pain, fatigue and palpitations Gastrointestinal: negative for abdominal pain and change in bowel habits Musculoskeletal:negative for myalgias Neurological: negative for gait problems and tremors Behavioral/Psych: negative for abusive relationship, depression Endocrine: negative for temperature intolerance    Genitourinary: positive for tin, grey malodorous vaginal  discharge.  negative for abnormal menstrual periods, genital lesions, hot flashes, sexual problems  Integument/breast: negative for breast lump, breast tenderness, nipple discharge and skin lesion(s)    Objective:       BP 114/77   Pulse 64   Ht 5' (1.524 m)   Wt 115 lb (52.2 kg)   LMP 01/02/2023 (Exact Date)   BMI 22.46 kg/m  General:   Alert and no distress  Skin:   no rash or abnormalities  Lungs:   clear to auscultation bilaterally  Heart:   regular rate and rhythm, S1, S2 normal, no murmur, click, rub or gallop  Breasts:   normal without suspicious masses, skin or nipple changes or axillary nodes  Abdomen:  normal findings: no organomegaly, soft, non-tender and no hernia  Pelvis:  External genitalia: normal general appearance Urinary system: urethral meatus normal and bladder without fullness, nontender Vaginal: normal without tenderness, induration or masses Cervix: normal appearance Adnexa: normal bimanual exam Uterus: anteverted and non-tender, normal size   Lab Review Urine pregnancy test Labs reviewed yes Radiologic studies reviewed yes  I have spent a total of 20 minutes of face-to-face time, excluding clinical staff time, reviewing notes and preparing to see patient, ordering tests and/or medications, and counseling the patient.   Assessment:  1. Encounter for gynecological examination with Papanicolaou smear of cervix Rx: - Cytology - PAP( [place])  2. Vaginal discharge R6x: - Cervicovaginal ancillary only( [place])  3. Screen for STD (sexually transmitted disease) Rx: - HIV antibody (with reflex) - Hepatitis C Antibody - Hepatitis B Surface AntiGEN - RPR  4. Abnormal uterine bleeding (AUB) Rx: - CBC - Ferritin  5. At risk of UTI Rx: - Urine Culture  6. Migraine with aura and without status migrainosus, not intractable - clinically stable  7. Anxiety associated with depression Rx: - Ambulatory referral to Integrated Behavioral  Health      Plan:    Education reviewed: calcium supplements, depression evaluation, low fat, low cholesterol diet, safe sex/STD prevention, self breast exams, and weight bearing exercise. Follow up in: 1 year.    Orders Placed This Encounter  Procedures   Urine Culture   HIV antibody (with reflex)   Hepatitis C Antibody   Hepatitis B Surface AntiGEN   RPR   CBC   Ferritin   Ambulatory referral to Integrated Behavioral Health    Referral Priority:   Routine    Referral Type:   Consultation    Referral Reason:   Specialty Services Required    Number of Visits Requested:   1    Brock BadHarper, Brendan Gadson A, MD 01/06/2023 12:24 PM

## 2023-01-06 LAB — HEPATITIS C ANTIBODY: Hep C Virus Ab: NONREACTIVE

## 2023-01-06 LAB — RPR: RPR Ser Ql: NONREACTIVE

## 2023-01-06 LAB — CBC
Hematocrit: 36.1 % (ref 34.0–46.6)
Hemoglobin: 12.5 g/dL (ref 11.1–15.9)
MCH: 32.4 pg (ref 26.6–33.0)
MCHC: 34.6 g/dL (ref 31.5–35.7)
MCV: 94 fL (ref 79–97)
Platelets: 202 10*3/uL (ref 150–450)
RBC: 3.86 x10E6/uL (ref 3.77–5.28)
RDW: 13.1 % (ref 11.7–15.4)
WBC: 4.9 10*3/uL (ref 3.4–10.8)

## 2023-01-06 LAB — HIV ANTIBODY (ROUTINE TESTING W REFLEX): HIV Screen 4th Generation wRfx: NONREACTIVE

## 2023-01-06 LAB — HEPATITIS B SURFACE ANTIGEN: Hepatitis B Surface Ag: NEGATIVE

## 2023-01-06 LAB — FERRITIN: Ferritin: 45 ng/mL (ref 15–150)

## 2023-01-08 ENCOUNTER — Other Ambulatory Visit: Payer: Self-pay | Admitting: Obstetrics

## 2023-01-08 DIAGNOSIS — B379 Candidiasis, unspecified: Secondary | ICD-10-CM

## 2023-01-08 LAB — CERVICOVAGINAL ANCILLARY ONLY
Bacterial Vaginitis (gardnerella): NEGATIVE
Candida Glabrata: NEGATIVE
Candida Vaginitis: POSITIVE — AB
Chlamydia: NEGATIVE
Comment: NEGATIVE
Comment: NEGATIVE
Comment: NEGATIVE
Comment: NEGATIVE
Comment: NEGATIVE
Comment: NORMAL
Neisseria Gonorrhea: NEGATIVE
Trichomonas: NEGATIVE

## 2023-01-08 LAB — CYTOLOGY - PAP
Comment: NEGATIVE
Diagnosis: NEGATIVE
High risk HPV: NEGATIVE

## 2023-01-08 MED ORDER — FLUCONAZOLE 150 MG PO TABS
150.0000 mg | ORAL_TABLET | Freq: Once | ORAL | 0 refills | Status: AC
Start: 2023-01-08 — End: 2023-01-08

## 2023-01-08 NOTE — BH Specialist Note (Signed)
Integrated Behavioral Health Initial In-Person Visit  MRN: [id number] Name: Ann Santiago  Number of Integrated Behavioral Health Clinician visits: 1 Session Start time:   11:20pm Session End time: 11:52pm Total time in minutes: 32 mins in person at [facility]   Types of Service: Individual psychotherapy  Interpretor:Yes.   Interpretor Name and Language: none   Warm Hand Off Completed.        Subjective: Ann Santiago is a 32 y.o. female accompanied by n/a Patient was referred by Dr. Clearance Coots for anxiety. Patient reports the following symptoms/concerns: anxiety Duration of problem: over one year ; Severity of problem: mild  Objective: Mood: good and Affect: Appropriate Risk of harm to self or others: No plan to harm self or others  Life Context: Family and Social: Lives with children/Family is supportive  School/Work: employed  Self-Care: None Life Changes: n/a  Patient and/or Family's Strengths/Protective Factors: Concrete supports in place (healthy food, safe environments, etc.)  Goals Addressed: Patient will: Reduce symptoms of: anxiety Increase knowledge and/or ability of: coping skills  Demonstrate ability to: Increase healthy adjustment to current life circumstances  Progress towards Goals: Ongoing  Interventions: Interventions utilized: Supportive Counseling  Standardized Assessments completed: PHQ 9  Patient and/or Family Response: Ann Santiago reported increased stress and anxiety due to work and parenting tasks. Ann Santiago reports family is supportive however she is feels overwhelmed occasionally.   Assessment: Patient currently experiencing anxiety.   Patient may benefit from integrated behavioral health.  Plan: Follow up with behavioral health clinician on : 01/09/2023 Behavioral recommendations: Prioritize rest, self care, create boundaries, stress reducing activity.  Referral(s): Integrated [facility] (In Clinic) "From scale of 1-10,  how likely are you to follow plan?":    Gwyndolyn Saxon, LCSW

## 2023-01-11 NOTE — Progress Notes (Signed)
Results viewed by pt in [facility]. Msg sent via [facility] including DX, RX and pt education.

## 2023-01-12 ENCOUNTER — Encounter: Payer: BC Managed Care – PPO | Admitting: Licensed Clinical Social Worker

## 2023-01-31 DIAGNOSIS — Z419 Encounter for procedure for purposes other than remedying health state, unspecified: Secondary | ICD-10-CM | POA: Diagnosis not present

## 2023-02-09 ENCOUNTER — Ambulatory Visit: Payer: BC Managed Care – PPO | Admitting: Family

## 2023-03-03 DIAGNOSIS — Z419 Encounter for procedure for purposes other than remedying health state, unspecified: Secondary | ICD-10-CM | POA: Diagnosis not present

## 2023-03-09 ENCOUNTER — Ambulatory Visit: Payer: BC Managed Care – PPO | Admitting: Family

## 2023-04-02 DIAGNOSIS — Z419 Encounter for procedure for purposes other than remedying health state, unspecified: Secondary | ICD-10-CM | POA: Diagnosis not present

## 2023-05-03 DIAGNOSIS — Z419 Encounter for procedure for purposes other than remedying health state, unspecified: Secondary | ICD-10-CM | POA: Diagnosis not present

## 2023-05-10 ENCOUNTER — Ambulatory Visit: Payer: BC Managed Care – PPO

## 2023-05-10 DIAGNOSIS — G43119 Migraine with aura, intractable, without status migrainosus: Secondary | ICD-10-CM | POA: Diagnosis not present

## 2023-05-28 ENCOUNTER — Other Ambulatory Visit (HOSPITAL_COMMUNITY)
Admission: RE | Admit: 2023-05-28 | Discharge: 2023-05-28 | Disposition: A | Discharge status: Home / Self Care | Payer: BC Managed Care – PPO | Source: Ambulatory Visit | Attending: Obstetrics and Gynecology | Admitting: Obstetrics and Gynecology

## 2023-05-28 ENCOUNTER — Ambulatory Visit (INDEPENDENT_AMBULATORY_CARE_PROVIDER_SITE_OTHER): Payer: BC Managed Care – PPO | Admitting: *Deleted

## 2023-05-28 VITALS — BP 111/64 | HR 77 | Ht 61.0 in | Wt 114.0 lb

## 2023-05-28 DIAGNOSIS — N898 Other specified noninflammatory disorders of vagina: Secondary | ICD-10-CM

## 2023-05-28 DIAGNOSIS — B3731 Acute candidiasis of vulva and vagina: Secondary | ICD-10-CM | POA: Diagnosis not present

## 2023-05-28 DIAGNOSIS — Z113 Encounter for screening for infections with a predominantly sexual mode of transmission: Secondary | ICD-10-CM | POA: Diagnosis not present

## 2023-05-28 NOTE — Progress Notes (Signed)
SUBJECTIVE:  32 y.o. female complains of foul and grey vaginal discharge for 2 week(s). Denies abnormal vaginal bleeding or significant pelvic pain or fever. No UTI symptoms. Denies history of known exposure to STD.  No LMP recorded.  OBJECTIVE:  She appears well, afebrile. Urine dipstick: not done.  ASSESSMENT:  Vaginal Discharge  Vaginal Odor   PLAN:  GC, chlamydia, trichomonas, BVAG, CVAG probe sent to lab. Treatment: To be determined once lab results are received ROV prn if symptoms persist or worsen.

## 2023-05-30 LAB — CERVICOVAGINAL ANCILLARY ONLY
Bacterial Vaginitis (gardnerella): NEGATIVE
Candida Glabrata: NEGATIVE
Candida Vaginitis: POSITIVE — AB
Chlamydia: NEGATIVE
Comment: NEGATIVE
Comment: NEGATIVE
Comment: NEGATIVE
Comment: NEGATIVE
Comment: NEGATIVE
Comment: NORMAL
Neisseria Gonorrhea: NEGATIVE
Trichomonas: NEGATIVE

## 2023-05-30 MED ORDER — FLUCONAZOLE 150 MG PO TABS: 150.0000 mg | ORAL_TABLET | Freq: Once | ORAL | 0 refills | Status: AC

## 2023-05-30 NOTE — Addendum Note (Signed)
Addended by: Catalina Antigua on: 05/30/2023 12:06 PM   Modules accepted: Orders

## 2023-06-03 DIAGNOSIS — Z419 Encounter for procedure for purposes other than remedying health state, unspecified: Secondary | ICD-10-CM | POA: Diagnosis not present

## 2023-06-13 NOTE — Progress Notes (Signed)
Patient was assessed and managed by nursing staff during this encounter. I have reviewed the chart and agree with the documentation and plan. I have also made any necessary editorial changes.  Catalina Antigua, MD 06/13/2023 5:02 PM

## 2023-07-03 DIAGNOSIS — Z419 Encounter for procedure for purposes other than remedying health state, unspecified: Secondary | ICD-10-CM | POA: Diagnosis not present

## 2023-08-03 DIAGNOSIS — Z419 Encounter for procedure for purposes other than remedying health state, unspecified: Secondary | ICD-10-CM | POA: Diagnosis not present

## 2023-09-02 DIAGNOSIS — Z419 Encounter for procedure for purposes other than remedying health state, unspecified: Secondary | ICD-10-CM | POA: Diagnosis not present

## 2023-10-03 DIAGNOSIS — Z419 Encounter for procedure for purposes other than remedying health state, unspecified: Secondary | ICD-10-CM | POA: Diagnosis not present

## 2023-11-03 DIAGNOSIS — Z419 Encounter for procedure for purposes other than remedying health state, unspecified: Secondary | ICD-10-CM | POA: Diagnosis not present

## 2023-11-27 ENCOUNTER — Other Ambulatory Visit (HOSPITAL_COMMUNITY): Payer: Self-pay

## 2023-11-27 ENCOUNTER — Telehealth: Payer: Self-pay

## 2023-11-27 NOTE — Telephone Encounter (Signed)
 Pharmacy Patient Advocate Encounter   Received notification from Onbase that prior authorization for Emgality Auto injector is required/requested.   Insurance verification completed.   The patient is insured through  [employer]  .   Per test claim: PA required and submitted KEY/EOC/Request #: [id number] APPROVED from 11/27/2023 to 02/24/2024

## 2023-12-01 DIAGNOSIS — Z419 Encounter for procedure for purposes other than remedying health state, unspecified: Secondary | ICD-10-CM | POA: Diagnosis not present

## 2024-01-12 DIAGNOSIS — Z419 Encounter for procedure for purposes other than remedying health state, unspecified: Secondary | ICD-10-CM | POA: Diagnosis not present

## 2024-02-11 DIAGNOSIS — Z419 Encounter for procedure for purposes other than remedying health state, unspecified: Secondary | ICD-10-CM | POA: Diagnosis not present

## 2024-03-11 ENCOUNTER — Telehealth: Payer: Self-pay

## 2024-03-11 ENCOUNTER — Other Ambulatory Visit (HOSPITAL_COMMUNITY): Payer: Self-pay

## 2024-03-11 NOTE — Telephone Encounter (Signed)
 Pharmacy Patient Advocate Encounter   Received notification from CoverMyMeds that prior authorization for Emgality  120MG /ML auto-injectors (migraine)  is required/requested.   Insurance verification completed.   The patient is insured through [employer] .   Per test claim: PA required; PA submitted to above mentioned insurance via CoverMyMeds Key/confirmation #/EOC [id number] Status is pending

## 2024-03-12 ENCOUNTER — Other Ambulatory Visit (HOSPITAL_COMMUNITY): Payer: Self-pay

## 2024-03-12 NOTE — Telephone Encounter (Signed)
 Pharmacy Patient Advocate Encounter  Received notification from [facility] that Prior Authorization for Emgality  120MG /ML auto-injectors (migraine)  has been DENIED.  See denial reason below. No denial letter attached in CMM. Will attach denial letter to Media tab once received.   PA #/Case ID/Reference #: [id number]

## 2024-03-13 DIAGNOSIS — Z419 Encounter for procedure for purposes other than remedying health state, unspecified: Secondary | ICD-10-CM | POA: Diagnosis not present

## 2024-03-17 NOTE — Telephone Encounter (Signed)
 Pt not seen in office for over a year and we did not send a medication refill or a prior authorization for Emgality . She needs to follow up with the provider that ordered this. thanks.

## 2024-04-12 DIAGNOSIS — Z419 Encounter for procedure for purposes other than remedying health state, unspecified: Secondary | ICD-10-CM | POA: Diagnosis not present

## 2024-04-23 ENCOUNTER — Emergency Department (HOSPITAL_COMMUNITY)

## 2024-04-23 ENCOUNTER — Emergency Department (HOSPITAL_COMMUNITY)
Admission: EM | Admit: 2024-04-23 | Discharge: 2024-04-23 | Disposition: A | Discharge status: Home / Self Care | Attending: Emergency Medicine | Admitting: Emergency Medicine

## 2024-04-23 ENCOUNTER — Encounter (HOSPITAL_COMMUNITY): Payer: Self-pay

## 2024-04-23 ENCOUNTER — Other Ambulatory Visit: Payer: Self-pay

## 2024-04-23 DIAGNOSIS — R519 Headache, unspecified: Secondary | ICD-10-CM | POA: Diagnosis not present

## 2024-04-23 DIAGNOSIS — R202 Paresthesia of skin: Secondary | ICD-10-CM | POA: Diagnosis not present

## 2024-04-23 DIAGNOSIS — R509 Fever, unspecified: Secondary | ICD-10-CM | POA: Diagnosis not present

## 2024-04-23 DIAGNOSIS — R200 Anesthesia of skin: Secondary | ICD-10-CM | POA: Diagnosis not present

## 2024-04-23 DIAGNOSIS — Z789 Other specified health status: Secondary | ICD-10-CM | POA: Diagnosis not present

## 2024-04-23 DIAGNOSIS — R531 Weakness: Secondary | ICD-10-CM | POA: Diagnosis not present

## 2024-04-23 DIAGNOSIS — R9082 White matter disease, unspecified: Secondary | ICD-10-CM | POA: Diagnosis not present

## 2024-04-23 DIAGNOSIS — R9431 Abnormal electrocardiogram [ECG] [EKG]: Secondary | ICD-10-CM | POA: Diagnosis not present

## 2024-04-23 DIAGNOSIS — G35 Multiple sclerosis: Secondary | ICD-10-CM | POA: Diagnosis not present

## 2024-04-23 LAB — CBC
HCT: 36 % (ref 36.0–46.0)
Hemoglobin: 12.3 g/dL (ref 12.0–15.0)
MCH: 31.6 pg (ref 26.0–34.0)
MCHC: 34.2 g/dL (ref 30.0–36.0)
MCV: 92.5 fL (ref 80.0–100.0)
Platelets: 132 K/uL — ABNORMAL LOW (ref 150–400)
RBC: 3.89 MIL/uL (ref 3.87–5.11)
RDW: 12.2 % (ref 11.5–15.5)
WBC: 7.7 K/uL (ref 4.0–10.5)
nRBC: 0 % (ref 0.0–0.2)

## 2024-04-23 LAB — URINALYSIS, W/ REFLEX TO CULTURE (INFECTION SUSPECTED)
Bilirubin Urine: NEGATIVE
Glucose, UA: NEGATIVE mg/dL
Ketones, ur: NEGATIVE mg/dL
Leukocytes,Ua: NEGATIVE
Nitrite: POSITIVE — AB
Protein, ur: NEGATIVE mg/dL
Specific Gravity, Urine: 1.012 (ref 1.005–1.030)
pH: 7 (ref 5.0–8.0)

## 2024-04-23 LAB — BASIC METABOLIC PANEL WITH GFR
Anion gap: 9 (ref 5–15)
BUN: 7 mg/dL (ref 6–20)
CO2: 22 mmol/L (ref 22–32)
Calcium: 9.5 mg/dL (ref 8.9–10.3)
Chloride: 103 mmol/L (ref 98–111)
Creatinine, Ser: 0.62 mg/dL (ref 0.44–1.00)
GFR, Estimated: >60 mL/min (ref >60)
Glucose, Bld: 96 mg/dL (ref 70–99)
Potassium: 4 mmol/L (ref 3.5–5.1)
Sodium: 134 mmol/L — ABNORMAL LOW (ref 135–145)

## 2024-04-23 LAB — HCG, SERUM, QUALITATIVE: Preg, Serum: NEGATIVE

## 2024-04-23 MED ORDER — GADOBUTROL 1 MMOL/ML IV SOLN
5.0000 mL | Freq: Once | INTRAVENOUS | Status: AC | PRN
Start: 2024-04-23 — End: 2024-04-23
  Administered 2024-04-23: 5 mL via INTRAVENOUS

## 2024-04-23 MED ORDER — SODIUM CHLORIDE 0.9 % IV BOLUS
1000.0000 mL | Freq: Once | INTRAVENOUS | Status: AC
  Administered 2024-04-23: 1000 mL via INTRAVENOUS

## 2024-04-23 MED ORDER — METOCLOPRAMIDE HCL 5 MG/ML IJ SOLN
10.0000 mg | Freq: Once | INTRAMUSCULAR | Status: AC
  Administered 2024-04-23: 10 mg via INTRAVENOUS
  Filled 2024-04-23: qty 2

## 2024-04-23 MED ORDER — DIPHENHYDRAMINE HCL 50 MG/ML IJ SOLN
12.5000 mg | Freq: Once | INTRAMUSCULAR | Status: AC
  Administered 2024-04-23: 12.5 mg via INTRAVENOUS
  Filled 2024-04-23: qty 1

## 2024-04-23 NOTE — ED Notes (Signed)
 PA at the bedside.

## 2024-04-23 NOTE — ED Provider Notes (Signed)
 [place] EMERGENCY DEPARTMENT AT [facility] Provider Note   CSN: [id number] Arrival date & time: 04/23/24  9292     Patient presents with: No chief complaint on file.  HPI Ann Santiago is a 33 y.o. female presenting for headache and weakness. She states she has had a headache for a week and is worsening.  She also reports that shortly after the headache started she was having right-sided arm and leg weakness.  She states she has had headaches before with right-sided weakness.  She states after her headaches resolve her weakness resolves as well.  Also endorsing photophobia.  Denies fever or nuchal rigidity but does report some pain in the back of her neck. States she is able to move her extremities at this time but still feels weak on the right side.  Denies nausea vomiting, chest pain shortness of breath.  Also endorsing increased urinary frequency but no other urinary symptoms.   HPI     Prior to Admission medications   Medication Sig Start Date End Date Taking? Authorizing Provider  cyclobenzaprine  (FLEXERIL ) 5 MG tablet Take 1-2 tablets (5-10 mg total) by mouth 3 (three) times daily as needed for muscle spasms (migraines). 10/06/22   Lucius Krabbe, NP  FLUoxetine  (PROZAC ) 20 MG capsule Take 1 capsule (20 mg total) by mouth daily. 11/17/22   Lucius Krabbe, NP  Galcanezumab -gnlm (EMGALITY ) 120 MG/ML SOAJ Inject 120 mg into the skin every 30 (thirty) days. 10/06/22   Lucius Krabbe, NP  hydrOXYzine  (ATARAX ) 10 MG tablet Take 1 tablet (10 mg total) by mouth 3 (three) times daily as needed for anxiety. 11/17/22   Lucius Krabbe, NP  tranexamic acid  (LYSTEDA ) 650 MG TABS tablet Take 1-2 tablets (650-1,300 mg total) by mouth 3 (three) times daily. START on first day of menses, stop after 5 days or sooner if bleeding is less. 11/17/22   Lucius Krabbe, NP  rizatriptan  (MAXALT ) 5 MG tablet Take 1 tablet (5 mg total) by mouth as needed for migraine. May repeat in 2  hours if needed Patient not taking: Reported on 03/05/2019 10/03/18 06/11/19  Cleotilde Garnette HERO, MD    Allergies: Patient has no known allergies.    Review of Systems See HPI   Physical Exam   Vitals:   04/23/24 1156 04/23/24 1200  BP:  115/71  Pulse: 93 87  Resp:    Temp:    SpO2: 100% 100%    CONSTITUTIONAL:  well-appearing, NAD NEURO:  Alert and oriented x 3, CN 3-12 grossly intact, right sided upper and lower extremity weakness noted, sensation intact distally to light touch, able to do finger-to-nose bilaterally EYES:  eyes equal and reactive ENT/NECK:  Supple, no stridor  CARDIO:  Regular rate and rhythm, appears well-perfused  PULM:  No respiratory distress, CTAB GI/GU:  non-distended, soft, non tender MSK/SPINE:  No gross deformities, no edema, moves all extremities  SKIN:  no rash, atraumatic  *Additional and/or pertinent findings included in MDM below  (all labs ordered are listed, but only abnormal results are displayed) Labs Reviewed  BASIC METABOLIC PANEL WITH GFR - Abnormal; Notable for the following components:      Result Value   Sodium 134 (*)    All other components within normal limits  CBC - Abnormal; Notable for the following components:   Platelets 132 (*)    All other components within normal limits  URINALYSIS, W/ REFLEX TO CULTURE (INFECTION SUSPECTED) - Abnormal; Notable for the following components:   APPearance  HAZY (*)    Hgb urine dipstick MODERATE (*)    Nitrite POSITIVE (*)    Bacteria, UA RARE (*)    All other components within normal limits  HCG, SERUM, QUALITATIVE    EKG: EKG Interpretation Date/Time:  Wednesday April 23 2024 12:29:13 EDT Ventricular Rate:  89 PR Interval:  137 QRS Duration:  72 QT Interval:  330 QTC Calculation: 402 R Axis:   82  Text Interpretation: Sinus rhythm Confirmed by Ruthe Cornet [phone]) on 04/23/2024 12:31:55 PM  Radiology: MR Brain W and Wo Contrast Result Date: 04/23/2024 CLINICAL DATA:  Neuro  deficit, acute, stroke suspected. Headache and generalized weakness. Tingling in the hands and legs. EXAM: MRI HEAD WITHOUT AND WITH CONTRAST TECHNIQUE: Multiplanar, multiecho pulse sequences of the brain and surrounding structures were obtained without and with intravenous contrast. CONTRAST:  5mL GADAVIST  GADOBUTROL  1 MMOL/ML IV SOLN COMPARISON:  Head CT 04/23/2024 FINDINGS: Brain: There is extensive magnetic susceptibility artifact from orthodontic braces. This is particularly severe on diffusion weighted and susceptibility weighted imaging where large portions of the anterior cerebral hemispheres and posterior fossa are obscured, limiting assessment for acute infarct and hemorrhage. Within this limitation, no acute large territory infarct, intracranial hemorrhage, mass, midline shift, hydrocephalus, or extra-axial fluid collection is identified. There is mild cerebral atrophy. There are widespread T2 hyperintensities throughout the juxtacortical, deep, and periventricular white matter bilaterally. The periventricular white matter is particularly severely affected with areas of both confluent signal abnormality including in the temporal lobes as well as discrete foci radiating perpendicularly to the lateral ventricles. Some lesions demonstrate prominent T1 hypointensity. There is involvement of the corpus callosum with associated volume loss, and there are also multiple lesions in the brainstem, cerebellum, and middle cerebellar peduncles. No lesions enhance or demonstrate restricted diffusion. Vascular: Major intracranial vascular flow voids are preserved. Skull and upper cervical spine: Unremarkable bone marrow signal. Sinuses/Orbits: Unremarkable orbits. Paranasal sinuses and mastoid air cells are clear. Other: None. IMPRESSION: Extensive white matter disease typical of multiple sclerosis. No evidence of active demyelination. Electronically Signed   By: Dasie Hamburg M.D.   On: 04/23/2024 13:26   CT Head Wo  Contrast Result Date: 04/23/2024 EXAM: CT HEAD WITHOUT CONTRAST 04/23/2024 07:53:59 AM TECHNIQUE: CT of the head was performed without the administration of intravenous contrast. Automated exposure control, iterative reconstruction, and/or weight based adjustment of the mA/kV was utilized to reduce the radiation dose to as low as reasonably achievable. COMPARISON: CT head without contrast dated 06/02/2022. CLINICAL HISTORY: Headache, increasing frequency or severity. Headache, weakness. FINDINGS: BRAIN AND VENTRICLES: No acute hemorrhage. Gray-white differentiation is preserved. No hydrocephalus. No extra-axial collection. No mass effect or midline shift. ORBITS: No acute abnormality. SINUSES: No acute abnormality. SOFT TISSUES AND SKULL: No acute soft tissue abnormality. No skull fracture. IMPRESSION: 1. No acute intracranial abnormality. Electronically signed by: Lonni Necessary MD 04/23/2024 07:58 AM EDT RP Workstation: [id number]     Procedures   Medications Ordered in the ED  sodium chloride  0.9 % bolus 1,000 mL (0 mLs Intravenous Stopped 04/23/24 1238)  metoCLOPramide  (REGLAN ) injection 10 mg (10 mg Intravenous Given 04/23/24 0941)  diphenhydrAMINE  (BENADRYL ) injection 12.5 mg (12.5 mg Intravenous Given 04/23/24 0941)  gadobutrol  (GADAVIST ) 1 MMOL/ML injection 5 mL (5 mLs Intravenous Contrast Given 04/23/24 1304)    Clinical Course as of 04/23/24 1401  Wed Apr 23, 2024  0953 That her chest started hurting after receiving Benadryl  and Reglan .  Ordered EKG. [JR]  1345 Dr Vear. [JR]  Clinical Course User Index [JR] Malachai Schalk K, PA-C                                 Medical Decision Making Amount and/or Complexity of Data Reviewed Labs: ordered. Radiology: ordered.  Risk Prescription drug management.   Initial Impression and Ddx 33 year old well-appearing female presenting for headache and right-sided weakness.  Exam notable for right-sided leg and arm weakness.  DDx  includes stroke, complicated migraine, multiple sclerosis, electrolyte derangement, seizure, transverse myelitis, other. Patient PMH that increases complexity of ED encounter:  none  Interpretation of Diagnostics - I independent reviewed and interpreted the labs as followed: Hematuria, positive nitrite  - I independently visualized the following imaging with scope of interpretation limited to determining acute life threatening conditions related to emergency care: CT non con, which revealed non acute.  MRI of the brain revealing findings suggestive of MS but no active demyelination.  Shared findings with patient.  -I personally reviewed interpreted EKG which revealed sinus rhythm  Patient Reassessment and Ultimate Disposition/Management Workup today suggest possible multiple sclerosis without active demyelination.  Patient has no prior diagnosis.  Discussed with Dr. Lindzen who stated that she is appropriate to follow-up with Dr. Vear at [facility] neurology for further evaluation.  Discussed pertinent return precautions.  On reassessment weakness on the right side had improved considerably after treatment of her headache.  Patient is ambulatory.  Discharged good condition.  Patient management required discussion with the following services or consulting groups:  Neurology  Complexity of Problems Addressed Acute complicated illness or Injury  Additional Data Reviewed and Analyzed Further history obtained from: Past medical history and medications listed in the EMR and Prior ED visit notes  Patient Encounter Risk Assessment Consideration of hospitalization      Final diagnoses:  Nonintractable headache, unspecified chronicity pattern, unspecified headache type  Right arm numbness  Right leg numbness    ED Discharge Orders          Ordered    Ambulatory referral to Neurology       Comments: An appointment is requested in approximately: 1 week   04/23/24 1401                Lang Norleen POUR, PA-C 04/23/24 1401    Ruthe Cornet, DO 04/23/24 1404

## 2024-04-23 NOTE — ED Notes (Signed)
 Patient transported to MRI

## 2024-04-23 NOTE — ED Triage Notes (Addendum)
 Pt bib ems from home; c/o HA, generalized weakness, neck pain, tingling in hands and legs x 1 hour; T 100.71F temporal; 120/80, 96% RA, HR 98, cbg 99; hx migraines; pt reports she was seen for similar x 1 year ago and was told she was dehydrated; no meds pta  Verbal consent given for mse

## 2024-04-23 NOTE — ED Provider Triage Note (Signed)
 Emergency Medicine Provider Triage Evaluation Note  Ann Santiago , a 33 y.o. female  was evaluated in triage.  Pt complains of headache and weakness.  She states she has had a headache for a week that is worsening and this morning felt like she could not move her entire body.  Called EMS.  States she is able to move her extremities at this time.  Denies nausea vomiting, chest pain shortness of breath.  Review of Systems  Positive: See above Negative: See above  Physical Exam  BP 115/86 (BP Location: Left Arm)   Pulse 95   Temp 98.5 F (36.9 C)   Resp 15   Ht 5' 5 (1.651 m)   Wt 49.9 kg   SpO2 100%   BMI 18.30 kg/m  Gen:   Awake, no distress   Resp:  Normal effort  MSK:   Moves extremities without difficulty  Other:    Medical Decision Making  Medically screening exam initiated at 7:27 AM.  Appropriate orders placed.  Ann Santiago was informed that the remainder of the evaluation will be completed by another provider, this initial triage assessment does not replace that evaluation, and the importance of remaining in the ED until their evaluation is complete.  Work up started   Lang Norleen POUR, PA-C 04/23/24 [phone]

## 2024-04-23 NOTE — Discharge Instructions (Addendum)
 Evaluate today revealed concern for multiple sclerosis.  Please follow-up with [facility] (Dr. Charlie Crete) in the outpatient setting.  If you have worsening numbness or headache, visual disturbance or any other concerning symptom please return to the ED for further evaluation.

## 2024-04-27 NOTE — Progress Notes (Unsigned)
 [facility]  PATIENT: Ann Santiago DOB: [birth year]  REFERRING DOCTOR OR PCP: Norleen Essex, PA-C; Corean Comment, NP SOURCE: Patient, notes from emergency room, imaging and lab reports, MRI images personally reviewed.  _________________________________   HISTORICAL  CHIEF COMPLAINT:  Chief Complaint  Patient presents with   New Patient (Initial Visit)    Pt in room 11. Mom in room. Internal ED referral for abnormal MRI, eval for MS. Pt said prior to ER visit c/o right side numbness.  Still has right side numbness, pt reports having blurry vision, see floaters. Last eye exam was last year. Next eye exam due in August. Pt reports falls, runs in to things.     HISTORY OF PRESENT ILLNESS:  I had the pleasure of seeing your patient, Ann Santiago, at the [facility] at [facility] for neurologic consultation regarding her neurologic symptoms and abnormal brain MRIs consistent with relapsing remitting multiple sclerosis.  She is a 33 year old woman who presented to the emergency room on 04/23/2024 with headache and right-sided weakness.  Of note, she had had other episodes of headache with right-sided weakness in the past.  Headaches are associated with photophobia.  There is no nausea or vomiting.  She has increased urinary frequency.  While in the emergency room, she had an MRI of the brain showing changes consistent with multiple sclerosis involving the hemispheres and brainstem and cerebellum.  Spinal MRI was not performed.  Weakness improved with treatment of the headaches with IV Reglan  and Benadryl .  In retropsect, she had a couple episodes of right sided numbness, weakess and/or clumsiness since 2023 lasting days to weeks.    She also had an episode lasting 2 weeks of diplopia and mother noted the one eye seemed to rove.       In June 2019, she was seen by ophthalmology for right visual loss.  Optic discs were normal at that time.  However, OCT of  the retinal nerve fiber layer show temporal thinning bilaterally.  Humphrey visual field showed diffuse visual loss on the right.  She has about 15-18 migraine days a month lasting > 4 hours.   HA is located around her temples and eyes.   She denies N/V but has phonophobia and photophobia.    BC powders and Excedrin Migraine have not helped.   Laying down in dark room helps.  She has tried Nurtec and Ubrelvy with only mild benefit.    Topiramate had not helped  Currently, she notes balance is reduced.  She drags the right leg at times.   She still has numbness in the right leg.    Denies facial numbness.  Vision is symmetric and no diplopia but she sometimes sees dots in her vision.   She has urinary urgency but no incontinence.       She notes daily fatigue, a little worse in the heat.     She denies any cognitive issues.    She notes anxiety.     IMAGING: MRI of the brain 04/23/2024 showed T2/FLAIR hyperintense foci in the left medulla/inferior cerebellar peduncle, bilateral middle cerebellar peduncles, left cerebellar hemisphere, pons, possibly involving the left trigeminal dorsal root entry zone, and in the periventricular, juxtacortical and deep white matter of both cerebral hemispheres.  None of the foci enhance after contrast.    Mild corpus callosal atrophy noted.      REVIEW OF SYSTEMS: Constitutional: No fevers, chills, sweats, or change in appetite Eyes: No visual changes, double vision,  eye pain Ear, nose and throat: No hearing loss, ear pain, nasal congestion, sore throat Cardiovascular: No chest pain, palpitations Respiratory:  No shortness of breath at rest or with exertion.   No wheezes GastrointestinaI: No nausea, vomiting, diarrhea, abdominal pain, fecal incontinence Genitourinary:  No dysuria, urinary retention or frequency.  No nocturia. Musculoskeletal:  No neck pain, back pain Integumentary: No rash, pruritus, skin lesions Neurological: as above Psychiatric: No depression  at this time.  No anxiety Endocrine: No palpitations, diaphoresis, change in appetite, change in weigh or increased thirst Hematologic/Lymphatic:  No anemia, purpura, petechiae. Allergic/Immunologic: No itchy/runny eyes, nasal congestion, recent allergic reactions, rashes  ALLERGIES: No Known Allergies  HOME MEDICATIONS:  Current Outpatient Medications:    cyclobenzaprine  (FLEXERIL ) 5 MG tablet, Take 1-2 tablets (5-10 mg total) by mouth 3 (three) times daily as needed for muscle spasms (migraines)., Disp: 30 tablet, Rfl: 2   FLUoxetine  (PROZAC ) 20 MG capsule, Take 1 capsule (20 mg total) by mouth daily., Disp: 30 capsule, Rfl: 2   Galcanezumab -gnlm (EMGALITY ) 120 MG/ML SOAJ, Inject 120 mg into the skin every 30 (thirty) days., Disp: 1.12 mL, Rfl: 5   hydrOXYzine  (ATARAX ) 10 MG tablet, Take 1 tablet (10 mg total) by mouth 3 (three) times daily as needed for anxiety., Disp: 60 tablet, Rfl: 2   tranexamic acid  (LYSTEDA ) 650 MG TABS tablet, Take 1-2 tablets (650-1,300 mg total) by mouth 3 (three) times daily. START on first day of menses, stop after 5 days or sooner if bleeding is less. (Patient not taking: Reported on 04/30/2024), Disp: 30 tablet, Rfl: 2  PAST MEDICAL HISTORY: Past Medical History:  Diagnosis Date   Allergy    Anemia    Anxiety    Headache    History of chlamydia    Normal labor 02/01/2017   Normal vaginal delivery 02/04/2017   Preterm labor 02/01/2017   Vaginal Pap smear, abnormal     PAST SURGICAL HISTORY: Past Surgical History:  Procedure Laterality Date   TUBAL LIGATION Bilateral 02/02/2017   Procedure: POST PARTUM TUBAL LIGATION;  Surgeon: Izell Harari, MD;  Location: [facility];  Service: Gynecology;  Laterality: Bilateral;   WISDOM TOOTH EXTRACTION      FAMILY HISTORY: Family History  Problem Relation Age of Onset   Hypertension Maternal Grandmother     SOCIAL HISTORY: Social History   Socioeconomic History   Marital status: Single     Spouse name: Not on file   Number of children: 4   Years of education: Not on file   Highest education level: Not on file  Occupational History   Not on file  Tobacco Use   Smoking status: Never   Smokeless tobacco: Never  Vaping Use   Vaping status: Never Used  Substance and Sexual Activity   Alcohol use: Yes    Alcohol/week: 1.0 standard drink of alcohol    Types: 1 Standard drinks or equivalent per week    Comment: social   Drug use: Not Currently    Types: Marijuana   Sexual activity: Yes    Partners: Male    Birth control/protection: Surgical, None  Other Topics Concern   Not on file  Social History Narrative   Right handed    Wears contacts    Drinks coffee ( has not had any in a while due to illness)   Drinks soda 4-5 per week.   Social Drivers of Corporate investment banker Strain: Not on file  Food Insecurity: Not on file  Transportation Needs: Not on file  Physical Activity: Not on file  Stress: Not on file  Social Connections: Not on file  Intimate Partner Violence: Not on file       PHYSICAL EXAM  Vitals:   04/30/24 0845  BP: 115/75  Pulse: 65  Weight: 114 lb (51.7 kg)  Height: 5' 1 (1.549 m)    Body mass index is 21.54 kg/m.   General: The patient is well-developed and well-nourished and in no acute distress  HEENT:  Head is [place]/AT.  Sclera are anicteric.  Funduscopic exam shows normal optic discs and retinal vessels.  Neck: No carotid bruits are noted.  The neck is nontender.  Cardiovascular: The heart has a regular rate and rhythm with a normal S1 and S2. There were no murmurs, gallops or rubs.    Skin: Extremities are without rash or  edema.  Musculoskeletal:  Back is nontender  Neurologic Exam  Mental status: The patient is alert and oriented x 3 at the time of the examination. The patient has apparent normal recent and remote memory, with an apparently normal attention span and concentration ability.   Speech is  normal.  Cranial nerves: Extraocular movements are full. Pupils are equal, round, and reactive to light and accomodation.  Visual fields are full.  Facial symmetry is present. There is good facial sensation to soft touch bilaterally.Facial strength is normal.  Trapezius and sternocleidomastoid strength is normal. No dysarthria is noted.  The tongue is midline, and the patient has symmetric elevation of the soft palate. No obvious hearing deficits are noted.  Motor:  Muscle bulk is normal.   Tone is normal. Strength is  5 / 5 in all 4 extremities.   Sensory: She reports reduced vibration sensation in the right arm and leg relative to the left..  Coordination: Cerebellar testing reveals good finger-nose-finger and heel-to-shin bilaterally.  Gait and station: Station is normal.   Gait is mildly wide. Tandem gait is wide. Romberg is borderline.   Reflexes: Deep tendon reflexes are symmetric and normal in arms, 3+ in legs with spread and clonus at ankles, R>L.   Plantar responses are flexor.    DIAGNOSTIC DATA (LABS, IMAGING, TESTING) - I reviewed patient records, labs, notes, testing and imaging myself where available.  Lab Results  Component Value Date   WBC 7.7 04/23/2024   HGB 12.3 04/23/2024   HCT 36.0 04/23/2024   MCV 92.5 04/23/2024   PLT 132 (L) 04/23/2024      Component Value Date/Time   NA 134 (L) 04/23/2024 0727   K 4.0 04/23/2024 0727   CL 103 04/23/2024 0727   CO2 22 04/23/2024 0727   GLUCOSE 96 04/23/2024 0727   BUN 7 04/23/2024 0727   CREATININE 0.62 04/23/2024 0727   CALCIUM 9.5 04/23/2024 0727   PROT 7.6 09/05/2022 1034   ALBUMIN 4.7 09/05/2022 1034   AST 17 09/05/2022 1034   ALT 13 09/05/2022 1034   ALKPHOS 64 09/05/2022 1034   BILITOT 0.5 09/05/2022 1034   GFRNONAA >60 04/23/2024 0727   GFRAA >60 03/05/2019 1249        ASSESSMENT AND PLAN  Multiple sclerosis (HCC) - Plan: Hepatitis B surface antigen, IgG, IgA, IgM, MR CERVICAL SPINE W WO CONTRAST,  QuantiFERON-TB Gold Plus, Varicella zoster antibody, IgG, Hepatitis B core antibody, total, Hepatitis B surface antibody,qualitative, Hepatitis C antibody, CBC with Differential/Platelet, Comprehensive metabolic panel with GFR, MR THORACIC SPINE W WO CONTRAST, HIV Antibody (routine testing w rflx)  High risk  medication use - Plan: Hepatitis B surface antigen, IgG, IgA, IgM, MR CERVICAL SPINE W WO CONTRAST, QuantiFERON-TB Gold Plus, Varicella zoster antibody, IgG, Hepatitis B core antibody, total, Hepatitis B surface antibody,qualitative, Hepatitis C antibody, CBC with Differential/Platelet, Comprehensive metabolic panel with GFR, MR THORACIC SPINE W WO CONTRAST, HIV Antibody (routine testing w rflx)  Vitamin D  deficiency - Plan: VITAMIN D  25 Hydroxy (Vit-D Deficiency, Fractures)  Gait disturbance - Plan: MR CERVICAL SPINE W WO CONTRAST, MR THORACIC SPINE W WO CONTRAST  Encounter for screening for HIV  Migraine with aura and without status migrainosus, not intractable - Plan: Galcanezumab -gnlm (EMGALITY ) 120 MG/ML SOAJ   In summary, Ms. Bourne is a 33 year old woman with neurologic symptoms and MRI of the brain consistent with relapsing remitting multiple sclerosis.  She had an aggressive course with at least 3 or 4 exacerbations in the last 2 years for infratentorial lesions as well as supratentorial lesions.  We need to check MRI of the cervical and thoracic spine and I suspect that she will also have lesions there.  Because she is presenting in a more aggressive manner, we need to initiate treatment with a highly effective disease modifying therapy.  I discussed some options and we will begin Kesimpta.  We will check some lab work to make sure she does not have chronic infections or contraindications.    I showed her the MRI images and we discussed some features of multiple sclerosis and descriptions of relapses.    Additionally, she has chronic migraine headache.  She had done reasonably well on  Emgality  and we will get her back on that medication with rizatriptan  for breakthrough.  She will return to see me for regular visit in 6 months but sooner for new or worsening neurologic symptoms.  Thank you for asking me to see Ms. Winograd.  Please let me know if I can be of further assistance with her or other patients in the future.   Xan Ingraham A. Vear, MD, [facility] 04/30/2024, 9:11 AM Certified in Neurology, Clinical Neurophysiology, Sleep Medicine and Neuroimaging  [facility] [address] [place], [place] [address] [phone]

## 2024-04-29 ENCOUNTER — Telehealth: Payer: Self-pay | Admitting: Neurology

## 2024-04-29 NOTE — Telephone Encounter (Signed)
 Patient said I want my appointment put back on 8:30 am not 9:00am. Received a message my appointment was at 9:00am not 8:30 am My appointment was originally for 8:30 am. Informed patient your check in time is 8:30 am and you see Dr. Vear at 9:00am. She verbalized understand

## 2024-04-30 ENCOUNTER — Ambulatory Visit: Admitting: Neurology

## 2024-04-30 ENCOUNTER — Encounter: Payer: Self-pay | Admitting: Neurology

## 2024-04-30 ENCOUNTER — Telehealth: Payer: Self-pay

## 2024-04-30 ENCOUNTER — Other Ambulatory Visit (HOSPITAL_COMMUNITY): Payer: Self-pay

## 2024-04-30 ENCOUNTER — Encounter: Payer: Self-pay | Admitting: Family

## 2024-04-30 ENCOUNTER — Telehealth: Admitting: Family

## 2024-04-30 VITALS — BP 115/75 | HR 65 | Ht 61.0 in | Wt 114.0 lb

## 2024-04-30 DIAGNOSIS — F419 Anxiety disorder, unspecified: Secondary | ICD-10-CM

## 2024-04-30 DIAGNOSIS — F411 Generalized anxiety disorder: Secondary | ICD-10-CM | POA: Diagnosis not present

## 2024-04-30 DIAGNOSIS — N920 Excessive and frequent menstruation with regular cycle: Secondary | ICD-10-CM | POA: Diagnosis not present

## 2024-04-30 DIAGNOSIS — F32A Depression, unspecified: Secondary | ICD-10-CM

## 2024-04-30 DIAGNOSIS — Z114 Encounter for screening for human immunodeficiency virus [HIV]: Secondary | ICD-10-CM

## 2024-04-30 DIAGNOSIS — M62838 Other muscle spasm: Secondary | ICD-10-CM | POA: Diagnosis not present

## 2024-04-30 DIAGNOSIS — G43109 Migraine with aura, not intractable, without status migrainosus: Secondary | ICD-10-CM

## 2024-04-30 DIAGNOSIS — Z79899 Other long term (current) drug therapy: Secondary | ICD-10-CM | POA: Diagnosis not present

## 2024-04-30 DIAGNOSIS — G35 Multiple sclerosis: Secondary | ICD-10-CM

## 2024-04-30 DIAGNOSIS — E559 Vitamin D deficiency, unspecified: Secondary | ICD-10-CM | POA: Diagnosis not present

## 2024-04-30 DIAGNOSIS — G43701 Chronic migraine without aura, not intractable, with status migrainosus: Secondary | ICD-10-CM

## 2024-04-30 DIAGNOSIS — R269 Unspecified abnormalities of gait and mobility: Secondary | ICD-10-CM

## 2024-04-30 MED ORDER — QULIPTA 30 MG PO TABS: 30.0000 mg | ORAL_TABLET | Freq: Every day | ORAL | 11 refills | Status: DC

## 2024-04-30 MED ORDER — RIZATRIPTAN BENZOATE 10 MG PO TBDP: 10.0000 mg | ORAL_TABLET | ORAL | 11 refills | Status: AC | PRN

## 2024-04-30 MED ORDER — TRANEXAMIC ACID 650 MG PO TABS: 650.0000 mg | ORAL_TABLET | Freq: Three times a day (TID) | ORAL | 2 refills | Status: AC

## 2024-04-30 MED ORDER — CYCLOBENZAPRINE HCL 5 MG PO TABS: 5.0000 mg | ORAL_TABLET | Freq: Three times a day (TID) | ORAL | 2 refills | Status: AC | PRN

## 2024-04-30 MED ORDER — HYDROXYZINE HCL 10 MG PO TABS: 5.0000 mg | ORAL_TABLET | Freq: Three times a day (TID) | ORAL | 5 refills | Status: AC | PRN

## 2024-04-30 MED ORDER — EMGALITY 120 MG/ML ~~LOC~~ SOAJ: 120.0000 mg | SUBCUTANEOUS | 11 refills | Status: AC

## 2024-04-30 MED ORDER — FLUOXETINE HCL 20 MG PO CAPS: 20.0000 mg | ORAL_CAPSULE | Freq: Every day | ORAL | 11 refills | Status: AC

## 2024-04-30 NOTE — Assessment & Plan Note (Signed)
 Experiencing heavy bleeding impacting daily activities. Discussed treatment options with neurology. Plans to try suggested medication next cycle. - Resend Tranexamic acid  650mg  tid for heavy menstrual bleeding, take on days of bleeding then stop. - Call office if med not working

## 2024-04-30 NOTE — Telephone Encounter (Signed)
 Please note that an Appeal would be needed as there was previously on denial on file for this rx on 03/11/24. I will let our PharmD on duty know that an Appeal is needed for this Rx.        Note to PharmD on duty:  Previous Denial key:  [id number]  (Please note Topiramate and metoprolol has been trialed and failed. Also Emgality  has been approved before     Original denial

## 2024-04-30 NOTE — Progress Notes (Signed)
 MyChart Video Visit    Virtual Visit via Video Note   This format is felt to be most appropriate for this patient at this time. Physical exam was limited by quality of the video and audio technology used for the visit. CMA was able to get the patient set up on a video visit.  Patient location: Home. Patient and provider in visit Provider location: Office  I discussed the limitations of evaluation and management by telemedicine and the availability of in person appointments. The patient expressed understanding and agreed to proceed.  Visit Date: 04/30/2024  Today's healthcare provider: Lucius Krabbe, NP     Subjective:   Patient ID: Ann Santiago, female    DOB: 1990/10/13, 33 y.o.   MRN: [id number]  Chief Complaint  Patient presents with   Migraine    Medication check   Depression    F/u   Anxiety    F/u  Discussed the use of AI scribe software for clinical note transcription with the patient, who gave verbal consent to proceed.  History of Present Illness Ann Santiago is a 33 year old female with multiple sclerosis who presents for follow-up and medication management.  Multiple sclerosis symptoms  - Recently diagnosed with multiple sclerosis - Migraines - Initiating Kesimpta therapy with an initial dosing schedule of weekly injections, transitioning to monthly - Muscle spasms and sharp neck pain radiating down the spine, managed with cyclobenzaprine  in past prn, with desire to continue for symptom relief - Frequent urination, not considered problematic  Migraine headaches - Experiences migraines - Neurologist seen this am & Emgality  ordered for migraine prevention - Also given Maxalt  for acute migraine attacks  Mood and anxiety symptoms - Resuming fluoxetine  after a temporary lapse due to insurance issues, now resolved with Medicaid coverage - Manages increased anxiety episodes with prn hydroxyzine , taking up to three times per day some days  Menstrual  irregularities - Heavy menstrual cycles - Plans to start Tranexamic acid  for heavy menses now that it is covered by Medicaid, never tried when ordered last year  Assessment & Plan Multiple sclerosis Recently diagnosed. Seen by Neurology this am & initiated Kesimpta treatment. Educated on self-administration of subcutaneous injection weekly for four weeks, then monthly. - Continue Kesimpta as prescribed by neurology. - Keep f/u appts, offered encouragement, try to seek out support groups online or ask Neuro office.  Migraine Prescribed Emgality  for prevention and Maxalt  for acute attacks. Discussed preventive use of Emgality  and Maxalt  for breakthrough migraines. - Continue Emgality  for migraine prevention. - Use Maxalt  for acute migraine attacks. - Continue to follow with Neuro  Depression Previously prescribed fluoxetine  20mg  qd. Wants to resume treatment since now with Medicaid coverage after insurance lapse. - Refill fluoxetine  prescription. - F/U in 3-47mos or prn  Anxiety disorder Experiencing anxiety, particularly at work. Previously prescribed hydroxyzine  for acute episodes. Using half doses to manage symptoms without excessive drowsiness. - Refill hydroxyzine  prescription for anxiety management. - F/U in 3-28mos or prn  Muscle spasm of neck/spine Experiencing sharp neck and spine pains, likely MS-related. Neuro thinks will improve with MS medication. Previously prescribed cyclobenzaprine  for spasms. Wishes to continue medication as needed. - Refill cyclobenzaprine  5-10mg  tid prn prescription for muscle spasms. - F/U prn for refill  Heavy menstrual bleeding Experiencing heavy bleeding impacting daily activities. Discussed treatment options with neurology. Plans to try suggested medication next cycle. - Resend Tranexamic acid  650mg  tid for heavy menstrual bleeding, take on days of bleeding then stop. -  Call office if med not working    Past Medical History:  Diagnosis Date    Allergy    Anemia    Anxiety    Headache    History of chlamydia    Normal labor 02/01/2017   Normal vaginal delivery 02/04/2017   Preterm labor 02/01/2017   Vaginal Pap smear, abnormal     Past Surgical History:  Procedure Laterality Date   TUBAL LIGATION Bilateral 02/02/2017   Procedure: POST PARTUM TUBAL LIGATION;  Surgeon: Izell Harari, MD;  Location: [facility] BIRTHING SU[facility];  Service: Gynecology;  Laterality: Bilateral;   WISDOM TOOTH EXTRACTION      Outpatient Medications Prior to Visit  Medication Sig Dispense Refill   cyclobenzaprine  (FLEXERIL ) 5 MG tablet Take 1-2 tablets (5-10 mg total) by mouth 3 (three) times daily as needed for muscle spasms (migraines). 30 tablet 2   FLUoxetine  (PROZAC ) 20 MG capsule Take 1 capsule (20 mg total) by mouth daily. 30 capsule 2   Galcanezumab -gnlm (EMGALITY ) 120 MG/ML SOAJ Inject 120 mg into the skin every 30 (thirty) days. 1.12 mL 11   hydrOXYzine  (ATARAX ) 10 MG tablet Take 1 tablet (10 mg total) by mouth 3 (three) times daily as needed for anxiety. 60 tablet 2   rizatriptan  (MAXALT -MLT) 10 MG disintegrating tablet Take 1 tablet (10 mg total) by mouth as needed for migraine. May repeat in 2 hours if needed 9 tablet 11   Atogepant  (QULIPTA ) 30 MG TABS Take 1 tablet (30 mg total) by mouth daily. 30 tablet 11   tranexamic acid  (LYSTEDA ) 650 MG TABS tablet Take 1-2 tablets (650-1,300 mg total) by mouth 3 (three) times daily. START on first day of menses, stop after 5 days or sooner if bleeding is less. (Patient not taking: Reported on 04/30/2024) 30 tablet 2   No facility-administered medications prior to visit.    No Known Allergies     Objective:   Physical Exam Vitals and nursing note reviewed.  Constitutional:      General: Pt is not in acute distress.    Appearance: Normal appearance.  HENT:     Head: Normocephalic.  Pulmonary:     Effort: No respiratory distress.  Musculoskeletal:     Cervical back: Normal range of motion.   Skin:    General: Skin is dry.     Coloration: Skin is not pale.  Neurological:     Mental Status: Pt is alert and oriented to person, place, and time.  Psychiatric:        Mood and Affect: Mood normal.   LMP 04/01/2024 (Exact Date)   Wt Readings from Last 3 Encounters:  04/30/24 114 lb (51.7 kg)  04/23/24 110 lb (49.9 kg)  05/28/23 114 lb (51.7 kg)     I discussed the assessment and treatment plan with the patient. The patient was provided an opportunity to ask questions and all were answered. The patient agreed with the plan and demonstrated an understanding of the instructions.   The patient was advised to call back or seek an in-person evaluation if the symptoms worsen or if the condition fails to improve as anticipated.  Lucius Krabbe, NP [facility] [phone] (phone) [phone] (fax)  [facility]

## 2024-04-30 NOTE — Assessment & Plan Note (Signed)
 Recently diagnosed. Seen by Neurology this am & initiated Kesimpta treatment. Educated on self-administration of subcutaneous injection weekly for four weeks, then monthly. - Continue Kesimpta as prescribed by neurology. - Keep f/u appts, offered encouragement, try to seek out support groups online or ask Neuro office.

## 2024-04-30 NOTE — Assessment & Plan Note (Signed)
 Previously prescribed fluoxetine  20mg  qd. Wants to resume treatment since now with Medicaid coverage after insurance lapse. Experiencing increased anxiety, particularly at work. Previously prescribed hydroxyzine  10mg  prn for acute episodes. Using half doses to manage symptoms without excessive drowsiness. - Refill fluoxetine  20mg  qd prescription. - Refill hydroxyzine  5-10mg  tid prn prescription for anxiety management. - F/U in 3-28mos or prn

## 2024-04-30 NOTE — Assessment & Plan Note (Signed)
 Prescribed Emgality  for prevention and Maxalt  for acute attacks. Discussed preventive use of Emgality  and Maxalt  for breakthrough migraines. Waiting on cervical MRIs to r/o MS lesions - Continue Emgality  for migraine prevention. - Use Maxalt  for acute migraine attacks. - Continue to follow with Neuro

## 2024-05-01 ENCOUNTER — Telehealth: Payer: Self-pay | Admitting: Pharmacist

## 2024-05-01 ENCOUNTER — Other Ambulatory Visit: Payer: Self-pay | Admitting: Neurology

## 2024-05-01 ENCOUNTER — Encounter: Payer: Self-pay | Admitting: Neurology

## 2024-05-01 MED ORDER — VITAMIN D (ERGOCALCIFEROL) 1.25 MG (50000 UNIT) PO CAPS: 50000.0000 [IU] | ORAL_CAPSULE | ORAL | 3 refills | Status: AC

## 2024-05-01 NOTE — Telephone Encounter (Signed)
 Appeal has been submitted for Emgality . Will advise when response is received, please be advised that most companies may take 30 days to make a decision.  Appeal letter and supporting documentation have been faxed to [phone] on 05/01/2024 @3 :28 pm.  Thank you, Devere Pandy, PharmD Clinical Pharmacist  [place]  Direct Dial: [phone]

## 2024-05-02 NOTE — Telephone Encounter (Signed)
 FYI,  I called and lvm for pt in regards to approval.

## 2024-05-02 NOTE — Telephone Encounter (Signed)
 Insurance has approved the appeal for Emgality :     Thank you, Devere Pandy, PharmD Clinical Pharmacist  [place]  Direct Dial : [phone]

## 2024-05-04 LAB — COMPREHENSIVE METABOLIC PANEL WITH GFR
ALT: 18 IU/L (ref 0–32)
AST: 22 IU/L (ref 0–40)
Albumin: 4.5 g/dL (ref 3.9–4.9)
Alkaline Phosphatase: 67 IU/L (ref 44–121)
BUN/Creatinine Ratio: 15 (ref 9–23)
BUN: 11 mg/dL (ref 6–20)
Bilirubin Total: 0.3 mg/dL (ref 0.0–1.2)
CO2: 21 mmol/L (ref 20–29)
Calcium: 9.9 mg/dL (ref 8.7–10.2)
Chloride: 104 mmol/L (ref 96–106)
Creatinine, Ser: 0.71 mg/dL (ref 0.57–1.00)
Globulin, Total: 3.1 g/dL (ref 1.5–4.5)
Glucose: 76 mg/dL (ref 70–99)
Potassium: 4.4 mmol/L (ref 3.5–5.2)
Sodium: 140 mmol/L (ref 134–144)
Total Protein: 7.6 g/dL (ref 6.0–8.5)
eGFR: 115 mL/min/1.73 (ref >59)

## 2024-05-04 LAB — HEPATITIS B SURFACE ANTIBODY,QUALITATIVE: Hep B Surface Ab, Qual: NONREACTIVE

## 2024-05-04 LAB — HEPATITIS B SURFACE ANTIGEN: Hepatitis B Surface Ag: NEGATIVE

## 2024-05-04 LAB — CBC WITH DIFFERENTIAL/PLATELET
Basophils Absolute: 0 x10E3/uL (ref 0.0–0.2)
Basos: 1 %
EOS (ABSOLUTE): 0.1 x10E3/uL (ref 0.0–0.4)
Eos: 3 %
Hematocrit: 37.3 % (ref 34.0–46.6)
Hemoglobin: 12.3 g/dL (ref 11.1–15.9)
Immature Grans (Abs): 0 x10E3/uL (ref 0.0–0.1)
Immature Granulocytes: 0 %
Lymphocytes Absolute: 1.2 x10E3/uL (ref 0.7–3.1)
Lymphs: 21 %
MCH: 31.9 pg (ref 26.6–33.0)
MCHC: 33 g/dL (ref 31.5–35.7)
MCV: 97 fL (ref 79–97)
Monocytes Absolute: 0.3 x10E3/uL (ref 0.1–0.9)
Monocytes: 5 %
Neutrophils Absolute: 4 x10E3/uL (ref 1.4–7.0)
Neutrophils: 70 %
Platelets: 269 x10E3/uL (ref 150–450)
RBC: 3.85 x10E6/uL (ref 3.77–5.28)
RDW: 12.7 % (ref 11.7–15.4)
WBC: 5.7 x10E3/uL (ref 3.4–10.8)

## 2024-05-04 LAB — QUANTIFERON-TB GOLD PLUS
QuantiFERON Mitogen Value: >10 [IU]/mL
QuantiFERON Nil Value: 0.01 [IU]/mL
QuantiFERON TB1 Ag Value: 0.01 [IU]/mL
QuantiFERON TB2 Ag Value: 0.04 [IU]/mL
QuantiFERON-TB Gold Plus: NEGATIVE

## 2024-05-04 LAB — IGG, IGA, IGM
IgA/Immunoglobulin A, Serum: 258 mg/dL (ref 87–352)
IgG (Immunoglobin G), Serum: 1362 mg/dL (ref 586–1602)
IgM (Immunoglobulin M), Srm: 101 mg/dL (ref 26–217)

## 2024-05-04 LAB — VARICELLA ZOSTER ANTIBODY, IGG: Varicella zoster IgG: REACTIVE

## 2024-05-04 LAB — HEPATITIS C ANTIBODY: Hep C Virus Ab: NONREACTIVE

## 2024-05-04 LAB — HEPATITIS B CORE ANTIBODY, TOTAL: Hep B Core Total Ab: NEGATIVE

## 2024-05-04 LAB — HIV ANTIBODY (ROUTINE TESTING W REFLEX): HIV Screen 4th Generation wRfx: NONREACTIVE

## 2024-05-04 LAB — VITAMIN D 25 HYDROXY (VIT D DEFICIENCY, FRACTURES): Vit D, 25-Hydroxy: 11.5 ng/mL — ABNORMAL LOW (ref 30.0–100.0)

## 2024-05-05 ENCOUNTER — Telehealth: Payer: Self-pay | Admitting: *Deleted

## 2024-05-05 ENCOUNTER — Other Ambulatory Visit (HOSPITAL_COMMUNITY): Payer: Self-pay

## 2024-05-05 NOTE — Telephone Encounter (Signed)
 Faxed notice of approval to [employer] at [phone]. Received fax confirmation.

## 2024-05-05 NOTE — Telephone Encounter (Signed)
 We received another PA request via fax for Emgality  but I see an appeal request was sent last week. Any updates on this? Thank you

## 2024-05-05 NOTE — Telephone Encounter (Signed)
 Spoke w/ Dr. Vear who states labs look good for pt to start on Kesimpta.  I sent pt mychart.   Faxed completed/signed Kesimpta start form to [employer] at [phone]. Received fax confirmation.

## 2024-05-05 NOTE — Telephone Encounter (Signed)
 Submitted PA on covermymeds. Key: [id number]. Waiting on determination from [facility] Medicaid of [place] .

## 2024-05-07 ENCOUNTER — Ambulatory Visit: Payer: Self-pay | Admitting: Neurology

## 2024-05-07 ENCOUNTER — Other Ambulatory Visit: Payer: Self-pay | Admitting: Neurology

## 2024-05-13 ENCOUNTER — Telehealth: Payer: Self-pay | Admitting: Neurology

## 2024-05-13 DIAGNOSIS — Z419 Encounter for procedure for purposes other than remedying health state, unspecified: Secondary | ICD-10-CM | POA: Diagnosis not present

## 2024-05-13 NOTE — Telephone Encounter (Signed)
 Arlina barrows: [id number] exp. 05/01/24-06/30/24 sent to GI [phone]

## 2024-05-15 NOTE — Addendum Note (Signed)
 Addended by: MINGO NEPTUNE A on: 05/15/2024 03:13 PM   Modules accepted: Orders

## 2024-05-19 ENCOUNTER — Other Ambulatory Visit

## 2024-05-21 ENCOUNTER — Other Ambulatory Visit: Payer: Self-pay | Admitting: Neurology

## 2024-05-21 NOTE — Telephone Encounter (Signed)
 Last seen 04/30/24 No 6 month follow up scheduled

## 2024-05-21 NOTE — Telephone Encounter (Signed)
 Patient scheduled on 12/23/24

## 2024-05-22 ENCOUNTER — Ambulatory Visit (INDEPENDENT_AMBULATORY_CARE_PROVIDER_SITE_OTHER): Admitting: Obstetrics

## 2024-05-22 ENCOUNTER — Other Ambulatory Visit (HOSPITAL_COMMUNITY)
Admission: RE | Admit: 2024-05-22 | Discharge: 2024-05-22 | Disposition: A | Discharge status: Home / Self Care | Source: Ambulatory Visit | Attending: Obstetrics | Admitting: Obstetrics

## 2024-05-22 ENCOUNTER — Ambulatory Visit: Admission: RE | Admit: 2024-05-22 | Source: Ambulatory Visit

## 2024-05-22 ENCOUNTER — Inpatient Hospital Stay: Admission: RE | Admit: 2024-05-22 | Source: Ambulatory Visit

## 2024-05-22 ENCOUNTER — Encounter: Payer: Self-pay | Admitting: Obstetrics

## 2024-05-22 VITALS — BP 116/75 | HR 77 | Ht 60.0 in | Wt 116.5 lb

## 2024-05-22 DIAGNOSIS — N939 Abnormal uterine and vaginal bleeding, unspecified: Secondary | ICD-10-CM

## 2024-05-22 DIAGNOSIS — N898 Other specified noninflammatory disorders of vagina: Secondary | ICD-10-CM | POA: Diagnosis not present

## 2024-05-22 DIAGNOSIS — Z01419 Encounter for gynecological examination (general) (routine) without abnormal findings: Secondary | ICD-10-CM | POA: Insufficient documentation

## 2024-05-22 DIAGNOSIS — N946 Dysmenorrhea, unspecified: Secondary | ICD-10-CM

## 2024-05-22 DIAGNOSIS — R300 Dysuria: Secondary | ICD-10-CM

## 2024-05-22 LAB — POCT URINALYSIS DIPSTICK
Glucose, UA: NEGATIVE
Ketones, UA: NEGATIVE
Nitrite, UA: NEGATIVE
Protein, UA: POSITIVE — AB
Spec Grav, UA: 1.02 (ref 1.010–1.025)
Urobilinogen, UA: 0.2 U/dL
pH, UA: 6 (ref 5.0–8.0)

## 2024-05-22 MED ORDER — VITAFOL ULTRA 29-0.6-0.4-200 MG PO CAPS: 1.0000 | ORAL_CAPSULE | Freq: Every day | ORAL | 4 refills | Status: AC

## 2024-05-22 MED ORDER — NITROFURANTOIN MONOHYD MACRO 100 MG PO CAPS: 100.0000 mg | ORAL_CAPSULE | Freq: Two times a day (BID) | ORAL | 2 refills | Status: AC

## 2024-05-22 MED ORDER — IBUPROFEN 800 MG PO TABS: 800.0000 mg | ORAL_TABLET | Freq: Three times a day (TID) | ORAL | 5 refills | Status: AC | PRN

## 2024-05-22 NOTE — Telephone Encounter (Signed)
 I called patient to discuss how her Kesimpta injection went.  No answer, voicemail was full.  I will attempt to send her a MyChart message too.

## 2024-05-22 NOTE — Progress Notes (Signed)
 Subjective:        Ann Santiago is a 33 y.o. female here for a routine exam.  Current complaints: Urinary frequency and burning, and vaginal discharge.    Personal health questionnaire:  Is patient [name], have a family history of breast and/or ovarian cancer: no Is there a family history of uterine cancer diagnosed at age < 45, gastrointestinal cancer, urinary tract cancer, family member who is a Personnel officer syndrome-associated carrier: no Is the patient overweight and hypertensive, family history of diabetes, personal history of gestational diabetes, preeclampsia or PCOS: no Is patient over 60, have PCOS,  family history of premature CHD under age 16, diabetes, smoke, have hypertension or peripheral artery disease:  no At any time, has a partner hit, kicked or otherwise hurt or frightened you?: no Over the past 2 weeks, have you felt down, depressed or hopeless?: no Over the past 2 weeks, have you felt little interest or pleasure in doing things?:no   Gynecologic History Patient's last menstrual period was 05/02/2024 (approximate). Contraception: tubal ligation Last Pap: 01-05-2023. Results were: normal Last mammogram: n/a. Results were: n/a  Obstetric History OB History  Gravida Para Term Preterm AB Living  4 4 3 1  4   SAB IAB Ectopic Multiple Live Births     0 4    # Outcome Date GA Lbr Len/2nd Weight Sex Type Anes PTL Lv  4 Preterm 02/02/17 [redacted]w[redacted]d 14:02 / 00:01 5 lb 13.8 oz (2.66 kg) M Vag-Spont None  LIV  3 Term 04/13/16 [redacted]w[redacted]d 05:56 / 00:16 6 lb 6.8 oz (2.914 kg) M Vag-Spont EPI  LIV  2 Term 05/05/12 [redacted]w[redacted]d 06:30 / 00:05 6 lb 8.4 oz (2.96 kg) F Vag-Spont None  LIV  1 Term 2011 [redacted]w[redacted]d  5 lb 11 oz (2.58 kg) F Vag-Spont   LIV    Past Medical History:  Diagnosis Date   Allergy    Anemia    Anxiety    Anxiety and depression 09/05/2022   Headache    History of chlamydia    Normal labor 02/01/2017   Normal vaginal delivery 02/04/2017   Preterm labor 02/01/2017    Vaginal Pap smear, abnormal     Past Surgical History:  Procedure Laterality Date   TUBAL LIGATION Bilateral 02/02/2017   Procedure: POST PARTUM TUBAL LIGATION;  Surgeon: Izell Harari, MD;  Location: [facility] BIRTHING SUIT[facility];  Service: Gynecology;  Laterality: Bilateral;   WISDOM TOOTH EXTRACTION       Current Outpatient Medications:    cyclobenzaprine  (FLEXERIL ) 5 MG tablet, Take 1-2 tablets (5-10 mg total) by mouth 3 (three) times daily as needed for muscle spasms (migraines)., Disp: 30 tablet, Rfl: 2   FLUoxetine  (PROZAC ) 20 MG capsule, Take 1 capsule (20 mg total) by mouth daily., Disp: 30 capsule, Rfl: 11   Galcanezumab -gnlm (EMGALITY ) 120 MG/ML SOAJ, Inject 120 mg into the skin every 30 (thirty) days., Disp: 1.12 mL, Rfl: 11   hydrOXYzine  (ATARAX ) 10 MG tablet, Take 0.5-1 tablets (5-10 mg total) by mouth 3 (three) times daily as needed for anxiety., Disp: 30 tablet, Rfl: 5   ibuprofen  (ADVIL ) 800 MG tablet, Take 1 tablet (800 mg total) by mouth every 8 (eight) hours as needed., Disp: 30 tablet, Rfl: 5   KESIMPTA 20 MG/0.4ML SOAJ, INJECT 20 MG (1 INJECTOR) SUBCUTANEOUSLY AT WEEK 0, 1, AND 2 FOR LOADING DOSE, Disp: 0.4 mL, Rfl: 0   nitrofurantoin , macrocrystal-monohydrate, (MACROBID ) 100 MG capsule, Take 1 capsule (100 mg total) by mouth 2 (two)  times daily. 1 po BID x 7days, Disp: 14 capsule, Rfl: 2   Prenat-Fe Poly-Methfol-FA-DHA (VITAFOL  ULTRA) 29-0.6-0.4-200 MG CAPS, Take 1 capsule by mouth daily before breakfast., Disp: 90 capsule, Rfl: 4   rizatriptan  (MAXALT -MLT) 10 MG disintegrating tablet, Take 1 tablet (10 mg total) by mouth as needed for migraine. May repeat in 2 hours if needed, Disp: 9 tablet, Rfl: 11   tranexamic acid  (LYSTEDA ) 650 MG TABS tablet, Take 1-2 tablets (650-1,300 mg total) by mouth 3 (three) times daily. START on first day of menses, stop after 5 days or sooner if bleeding is less., Disp: 30 tablet, Rfl: 2   Vitamin D , Ergocalciferol , (DRISDOL ) 1.25 MG (50000 UNIT) CAPS  capsule, Take 1 capsule (50,000 Units total) by mouth every 7 (seven) days., Disp: 13 capsule, Rfl: 3 No Known Allergies  Social History   Tobacco Use   Smoking status: Never   Smokeless tobacco: Never  Substance Use Topics   Alcohol use: Yes    Alcohol/week: 1.0 standard drink of alcohol    Types: 1 Standard drinks or equivalent per week    Comment: social    Family History  Problem Relation Age of Onset   Hypertension Maternal Grandmother       Review of Systems  Constitutional: negative for fatigue and weight loss Respiratory: negative for cough and wheezing Cardiovascular: negative for chest pain, fatigue and palpitations Gastrointestinal: negative for abdominal pain and change in bowel habits Musculoskeletal:negative for myalgias Neurological: negative for gait problems and tremors Behavioral/Psych: negative for abusive relationship, depression Endocrine: negative for temperature intolerance    Genitourinary:negative for abnormal menstrual periods, genital lesions, hot flashes, sexual problems and vaginal discharge Integument/breast: negative for breast lump, breast tenderness, nipple discharge and skin lesion(s)    Objective:       BP 116/75   Pulse 77   Ht 5' (1.524 m)   Wt 116 lb 8 oz (52.8 kg)   LMP 05/02/2024 (Approximate)   BMI 22.75 kg/m  General:   alert  Skin:   no rash or abnormalities  Lungs:   clear to auscultation bilaterally  Heart:   regular rate and rhythm, S1, S2 normal, no murmur, click, rub or gallop  Breasts:   normal without suspicious masses, skin or nipple changes or axillary nodes  Abdomen:  normal findings: no organomegaly, soft, non-tender and no hernia  Pelvis:  External genitalia: normal general appearance Urinary system: urethral meatus normal and bladder without fullness, nontender Vaginal: normal without tenderness, induration or masses Cervix: normal appearance Adnexa: normal bimanual exam Uterus: anteverted and non-tender,  normal size   Lab Review Urine pregnancy test Labs reviewed yes Radiologic studies reviewed no  I have spent a total of 20 minutes of face-to-face time, excluding clinical staff time, reviewing notes and preparing to see patient, ordering tests and/or medications, and counseling the patient.   Assessment:    1. Encounter for gynecological examination with Papanicolaou smear of cervix (Primary) Rx: - Cytology - PAP( [facility])  2. Vaginal discharge Rx: - Cervicovaginal ancillary only( [place])  3. Dysuria Rx: - POCT Urinalysis Dipstick - Urine Culture - nitrofurantoin , macrocrystal-monohydrate, (MACROBID ) 100 MG capsule; Take 1 capsule (100 mg total) by mouth 2 (two) times daily. 1 po BID x 7days  Dispense: 14 capsule; Refill: 2  4. Abnormal uterine bleeding (AUB) Rx: - Prenat-Fe Poly-Methfol-FA-DHA (VITAFOL  ULTRA) 29-0.6-0.4-200 MG CAPS; Take 1 capsule by mouth daily before breakfast.  Dispense: 90 capsule; Refill: 4  5. Dysmenorrhea Rx: - ibuprofen  (  ADVIL ) 800 MG tablet; Take 1 tablet (800 mg total) by mouth every 8 (eight) hours as needed.  Dispense: 30 tablet; Refill: 5     Plan:    Education reviewed: calcium supplements, depression evaluation, low fat, low cholesterol diet, safe sex/STD prevention, self breast exams, and weight bearing exercise. Follow up in: 1 year.   Meds ordered this encounter  Medications   nitrofurantoin , macrocrystal-monohydrate, (MACROBID ) 100 MG capsule    Sig: Take 1 capsule (100 mg total) by mouth 2 (two) times daily. 1 po BID x 7days    Dispense:  14 capsule    Refill:  2   ibuprofen  (ADVIL ) 800 MG tablet    Sig: Take 1 tablet (800 mg total) by mouth every 8 (eight) hours as needed.    Dispense:  30 tablet    Refill:  5   Prenat-Fe Poly-Methfol-FA-DHA (VITAFOL  ULTRA) 29-0.6-0.4-200 MG CAPS    Sig: Take 1 capsule by mouth daily before breakfast.    Dispense:  90 capsule    Refill:  4   Orders Placed This Encounter   Procedures   Urine Culture   POCT Urinalysis Dipstick    CARLIN RONAL CENTERS, MD, FACOG Attending Obstetrician & Gynecologist, [facility] for [facility], [facility], [place] 05/22/2024

## 2024-05-22 NOTE — Progress Notes (Signed)
 Pt presents for annual. Pt wants swab only std testing. Pt states that she has been having uti symptoms and has been taking Azo for relief. Pt has questions about a good multivitamin. No other questions or concerns at this time.

## 2024-05-23 LAB — CERVICOVAGINAL ANCILLARY ONLY
Bacterial Vaginitis (gardnerella): NEGATIVE
Candida Glabrata: NEGATIVE
Candida Vaginitis: NEGATIVE
Chlamydia: NEGATIVE
Comment: NEGATIVE
Comment: NEGATIVE
Comment: NEGATIVE
Comment: NEGATIVE
Comment: NEGATIVE
Comment: NORMAL
Neisseria Gonorrhea: NEGATIVE
Trichomonas: NEGATIVE

## 2024-05-25 LAB — URINE CULTURE

## 2024-05-26 ENCOUNTER — Other Ambulatory Visit: Payer: Self-pay | Admitting: Neurology

## 2024-05-26 DIAGNOSIS — R112 Nausea with vomiting, unspecified: Secondary | ICD-10-CM | POA: Diagnosis not present

## 2024-05-26 LAB — CYTOLOGY - PAP
Comment: NEGATIVE
Diagnosis: NEGATIVE
High risk HPV: NEGATIVE

## 2024-05-26 MED ORDER — ONDANSETRON 4 MG PO TBDP: 4.0000 mg | ORAL_TABLET | Freq: Three times a day (TID) | ORAL | 0 refills | Status: AC | PRN

## 2024-05-27 ENCOUNTER — Other Ambulatory Visit: Payer: Self-pay | Admitting: *Deleted

## 2024-05-27 DIAGNOSIS — G35 Multiple sclerosis: Secondary | ICD-10-CM

## 2024-05-27 MED ORDER — KESIMPTA 20 MG/0.4ML ~~LOC~~ SOAJ
0.4000 mL | SUBCUTANEOUS | 5 refills | Status: AC
Start: 2024-05-27 — End: ?

## 2024-05-28 ENCOUNTER — Ambulatory Visit: Payer: Self-pay | Admitting: Family Medicine

## 2024-05-28 DIAGNOSIS — N3001 Acute cystitis with hematuria: Secondary | ICD-10-CM

## 2024-05-29 MED ORDER — AMOXICILLIN-POT CLAVULANATE 875-125 MG PO TABS
1.0000 | ORAL_TABLET | Freq: Two times a day (BID) | ORAL | 0 refills | Status: AC
Start: 2024-05-29 — End: ?

## 2024-06-13 DIAGNOSIS — Z419 Encounter for procedure for purposes other than remedying health state, unspecified: Secondary | ICD-10-CM | POA: Diagnosis not present

## 2024-07-09 ENCOUNTER — Encounter: Payer: Self-pay | Admitting: Obstetrics

## 2024-07-13 DIAGNOSIS — Z419 Encounter for procedure for purposes other than remedying health state, unspecified: Secondary | ICD-10-CM | POA: Diagnosis not present

## 2024-07-15 ENCOUNTER — Encounter: Payer: Self-pay | Admitting: Obstetrics

## 2024-07-15 ENCOUNTER — Other Ambulatory Visit: Payer: Self-pay | Admitting: *Deleted

## 2024-07-15 DIAGNOSIS — B3731 Acute candidiasis of vulva and vagina: Secondary | ICD-10-CM

## 2024-07-15 MED ORDER — FLUCONAZOLE 150 MG PO TABS: 150.0000 mg | ORAL_TABLET | Freq: Once | ORAL | 0 refills | Status: AC

## 2024-07-15 NOTE — Progress Notes (Signed)
 Pt requested treatment for symptoms of a vaginal yeast infection via MyChart. Diflucan  RX per protocol. Annual exam UTD.

## 2024-08-20 ENCOUNTER — Ambulatory Visit: Admitting: Obstetrics

## 2024-09-16 ENCOUNTER — Other Ambulatory Visit (HOSPITAL_COMMUNITY)
Admission: RE | Admit: 2024-09-16 | Discharge: 2024-09-16 | Disposition: A | Discharge status: Home / Self Care | Source: Ambulatory Visit | Attending: Obstetrics | Admitting: Obstetrics

## 2024-09-16 ENCOUNTER — Ambulatory Visit: Payer: Self-pay

## 2024-09-16 VITALS — BP 105/67 | HR 78 | Wt 113.2 lb

## 2024-09-16 DIAGNOSIS — N898 Other specified noninflammatory disorders of vagina: Secondary | ICD-10-CM

## 2024-09-16 NOTE — Progress Notes (Signed)
..  SUBJECTIVE:  33 y.o. female complains of white vaginal discharge and odor for a couple of day(s). Denies abnormal vaginal bleeding or significant pelvic pain or fever. No UTI symptoms. Denies history of known exposure to STD.  No LMP recorded.  OBJECTIVE:  She appears well, afebrile. Urine dipstick: not done.  ASSESSMENT:  Vaginal Discharge  Vaginal Odor   PLAN:  GC, chlamydia, trichomonas, BVAG, CVAG probe sent to lab. Treatment: To be determined once lab results are received ROV prn if symptoms persist or worsen.

## 2024-09-17 ENCOUNTER — Ambulatory Visit: Payer: Self-pay | Admitting: Obstetrics and Gynecology

## 2024-09-17 DIAGNOSIS — B3731 Acute candidiasis of vulva and vagina: Secondary | ICD-10-CM

## 2024-09-17 LAB — CERVICOVAGINAL ANCILLARY ONLY
Bacterial Vaginitis (gardnerella): NEGATIVE
Candida Glabrata: NEGATIVE
Candida Vaginitis: POSITIVE — AB
Chlamydia: NEGATIVE
Comment: NEGATIVE
Comment: NEGATIVE
Comment: NEGATIVE
Comment: NEGATIVE
Comment: NEGATIVE
Comment: NORMAL
Neisseria Gonorrhea: NEGATIVE
Trichomonas: NEGATIVE

## 2024-09-17 MED ORDER — FLUCONAZOLE 150 MG PO TABS: 150.0000 mg | ORAL_TABLET | Freq: Once | ORAL | 0 refills | Status: AC

## 2024-09-19 ENCOUNTER — Telehealth: Payer: Self-pay

## 2024-09-19 ENCOUNTER — Other Ambulatory Visit (HOSPITAL_COMMUNITY): Payer: Self-pay

## 2024-09-19 NOTE — Telephone Encounter (Signed)
 Pharmacy Patient Advocate Encounter   Received notification from CoverMyMeds that prior authorization for Emgality  is required/requested.   Insurance verification completed.   The patient is insured through [facility] MEDICAID.   Per test claim: PA required; PA submitted to above mentioned insurance via Latent Key/confirmation #/EOC [id number] Status is pending

## 2024-09-19 NOTE — Telephone Encounter (Signed)
 Pharmacy Patient Advocate Encounter  Received notification from [facility] MEDICAID that Prior Authorization for Emgality  has been APPROVED from 09/19/2024 to 09/19/2025. Ran test claim, Copay is $4.00. This test claim was processed through [facility]- copay amounts may vary at other pharmacies due to pharmacy/plan contracts, or as the patient moves through the different stages of their insurance plan.   PA #/Case ID/Reference #: [id number]

## 2024-10-09 ENCOUNTER — Telehealth: Payer: Self-pay | Admitting: Neurology

## 2024-10-09 NOTE — Telephone Encounter (Signed)
 Sent Mychart and lvm informing pt needs to reschedule 3/24 appt

## 2024-12-23 ENCOUNTER — Ambulatory Visit: Admitting: Neurology
